# Patient Record
Sex: Female | Born: 1965 | Race: White | Hispanic: No | Marital: Married | Smoking: Current every day smoker
Health system: Southern US, Community
[De-identification: ages and names within clinical notes are randomized; demographics above are authoritative.]

## PROBLEM LIST (undated history)

## (undated) ENCOUNTER — Emergency Department (HOSPITAL_BASED_OUTPATIENT_CLINIC_OR_DEPARTMENT_OTHER)

## (undated) DIAGNOSIS — R319 Hematuria, unspecified: Secondary | ICD-10-CM

## (undated) DIAGNOSIS — F419 Anxiety disorder, unspecified: Secondary | ICD-10-CM

## (undated) DIAGNOSIS — M1990 Unspecified osteoarthritis, unspecified site: Secondary | ICD-10-CM

## (undated) DIAGNOSIS — B001 Herpesviral vesicular dermatitis: Secondary | ICD-10-CM

## (undated) HISTORY — PX: NOSE SURGERY: SHX723

## (undated) HISTORY — DX: Unspecified osteoarthritis, unspecified site: M19.90

## (undated) HISTORY — DX: Anxiety disorder, unspecified: F41.9

## (undated) HISTORY — DX: Herpesviral vesicular dermatitis: B00.1

## (undated) HISTORY — DX: Hematuria, unspecified: R31.9

## (undated) HISTORY — PX: HAND SURGERY: SHX662

---

## 2003-05-27 ENCOUNTER — Encounter (HOSPITAL_COMMUNITY): Admission: RE | Admit: 2003-05-27 | Discharge: 2003-08-25 | Payer: Self-pay | Admitting: Internal Medicine

## 2006-11-01 ENCOUNTER — Encounter: Admission: RE | Admit: 2006-11-01 | Discharge: 2006-11-01 | Payer: Self-pay | Admitting: Emergency Medicine

## 2007-03-24 ENCOUNTER — Ambulatory Visit: Payer: Self-pay | Admitting: Urology

## 2007-06-15 DIAGNOSIS — R319 Hematuria, unspecified: Secondary | ICD-10-CM

## 2007-06-15 HISTORY — DX: Hematuria, unspecified: R31.9

## 2007-12-21 ENCOUNTER — Encounter
Admission: RE | Admit: 2007-12-21 | Discharge: 2007-12-21 | Payer: Self-pay | Admitting: Physical Medicine and Rehabilitation

## 2007-12-29 ENCOUNTER — Ambulatory Visit: Payer: Self-pay | Admitting: Family Medicine

## 2008-01-24 ENCOUNTER — Inpatient Hospital Stay (HOSPITAL_COMMUNITY): Admission: RE | Admit: 2008-01-24 | Discharge: 2008-01-25 | Payer: Self-pay | Admitting: Orthopedic Surgery

## 2008-01-24 ENCOUNTER — Encounter (INDEPENDENT_AMBULATORY_CARE_PROVIDER_SITE_OTHER): Payer: Self-pay | Admitting: Orthopedic Surgery

## 2008-04-02 ENCOUNTER — Ambulatory Visit: Payer: Self-pay | Admitting: Family Medicine

## 2008-06-14 HISTORY — PX: SPINE SURGERY: SHX786

## 2010-06-03 ENCOUNTER — Ambulatory Visit: Payer: Self-pay | Admitting: Family Medicine

## 2010-07-04 ENCOUNTER — Encounter: Payer: Self-pay | Admitting: Internal Medicine

## 2010-10-27 NOTE — Op Note (Signed)
Nichole Klein, Nichole Klein NO.:  [id number]   MEDICAL RECORD NO.:  [id number]          PATIENT TYPE:  INP   LOCATION:  3017                         FACILITY:  [facility]   PHYSICIAN:  Nelda Severe, MD      DATE OF BIRTH:  1965/09/12   DATE OF PROCEDURE:  01/24/2008  DATE OF DISCHARGE:                               OPERATIVE REPORT   SURGEON:  Nelda Severe, MD   ASSISTANT:  Lianne Cure, PA-C   PREOPERATIVE DIAGNOSIS:  Left L5-S1 disk herniation.   POSTOPERATIVE DIAGNOSIS:  Left L5-S1 disk herniation.   OPERATIVE PROCEDURE:  Left L5-S1 laminotomy and disk excision.   OPERATIVE FINDINGS:  There was a subligamentous disk herniation in the  usual posterolateral location with a tiny rent in the posterior  longitudinal ligament, but no gross extrusion of the disk through the  rent.   OPERATIVE PROCEDURE:  The patient was placed under general endotracheal  anesthesia.  Prophylactic antibiotics were administered intravenously.  She was positioned kneeling on a Andrews frame.  The tibias were padded  with a gel pad on the tibial support.  The upper extremities were  positioned so as to avoid hyperflexion and abduction of the shoulders  and so as to avoid hyperflexion of the elbows.  Because of the patient's  thick adipose layer, it was difficult to palpate the bony landmarks of  the lumbar spine.  The vertical incision was placed just to the left of  midline at what I thought was probably the L4-L5 through L5-S1 level.  Once preparation had been carried out with DuraPrep and draping  performed, a cross-table lateral radiograph was taken with a spinal  needle placed at what we approximated to be the L4-L5 level.  This  radiograph revealed that the needle was at the L4 level.   Before making an incision, a time-out was held and the patient  identified, as well as the preoperative diagnosis, the intended  procedure, the likely length of the procedure, the anticipated  blood  loss, and introductions of the surgical team, etc., etc.   A incision was made just left of center over what was perceived to be  the L5-S1 disk space level.  Dissection was carried down through  approximately 1.5-2 inches of adipose tissue to the thoracolumbar  fascia.  An incision was made in the thoracolumbar fascia just to the  left of midline.  The muscles were elevated off the spinous processes at  what proved to be L5 and S1.  The posterior aspect of sacrum was  positively identified with there being no mobile segment below when we  were operating at.   Next, we performed a approximately 25% medial-inferior articular process  facetectomy at L5 and thinned out the lamina proximally with the 3-mm  Kerrison rongeur.  The ligamentum flavum was detached distally along the  lamina of L5 and medially under the supra-articular process of S1.  The  ligamentum flavum was then removed from the laminotomy site.  At this  point, having removed some more of the medial edge of the supra-  articular process of S1 with  a Kerrison rongeur, the exposure was  complete.  There was a large epidural vein, lateral to the S1 nerve root  which was bipolar coagulated.  The nerve root was mobilized and  retracted medially with a Love nerve root retractor.  It was then  possible to seat the disk herniation with a very tiny, less than 1 mm  rent, and a very tiny amount of nucleus pulposus exuding through the  rent.  This rent was enlarged with a Cytogeneticist and then a ball-  tipped nerve hook used to deliver numerous small fragments of disk.  We  then enlarged a rent and eventually performed a formal annulotomy with a  15 blade lateral to the point of disk extrusion.  The disk space was  then curetted with Epstein and Scoville curettes and eventually with a  ring curette and numerous small fragments of disk removed.  The S1 nerve  root appeared very well decompressed.  We did take a final  cross-table  lateral radiograph with a instrument in the L5-S1 disk space to confirm  and document that we were at the correct level.   The disk space was irrigated out with saline solution and no more  fragments were delivered.   There was minimal oozing, however, we did place a subfascial 15 gauge  Blake drain brought out through the skin to the left side and secured it  with a 2-0 nylon suture.  The thoracolumbar fascia was then reapposed  using interrupted figure-of-eight 0 Vicryl sutures.  The subcutaneous  layer was closed using 2-0 Vicryl in interrupted fashion.  The skin was  closed using a running subcuticular 3-0 undyed Vicryl and the skin edges  reinforced with Steri-Strips.  Antibiotic ointment dressing was applied  and secured with OpSite.   There were no intraoperative complications.  Blood loss was less than  100 mL.  The patient returned to recovery room in satisfactory condition  where she has been examined and has good power of dorsiflexion and  plantar flexion bilaterally and has no complaints of left leg pain.      Nelda Severe, MD  Electronically Signed     MT/MEDQ  D:  01/24/2008  T:  01/25/2008  Job:  [id number]

## 2010-10-27 NOTE — Discharge Summary (Signed)
NAMEAUBREE, Nichole Klein             ACCOUNT NO.:  [id number]   MEDICAL RECORD NO.:  [id number]          PATIENT TYPE:  INP   LOCATION:  3017                         FACILITY:  [facility]   PHYSICIAN:  Nelda Severe, MD      DATE OF BIRTH:  02-07-1966   DATE OF ADMISSION:  01/24/2008  DATE OF DISCHARGE:  01/25/2008                               DISCHARGE SUMMARY   FINAL DIAGNOSIS:  L5-S1 disk herniation, left side.   HOSPITAL COURSE:  The patient was brought to the operating room on the  day of admission.  An uneventful left L5-S1 laminotomy and disk excision  was carried out.  Postoperatively, she had no left leg pain, but did of  course had back pain in the region of the incision.  Pain was controlled  with oral analgesics.  She had voided.  The incision was inspected and  was dry without drainage.  A Blake drain which had been inserted at the  time of surgery was removed.   The patient was ambulatory.  She was given a prescription for Norco 10  one q.4 h. p.r.n. 75 tablets.   She was instructed to avoid prolonged sitting.  She will walk as much as  possible.  She will return to the office in approximately 2 weeks,  sooner if there is a problem.  I told her husband about the fact that  incisional pain is usually significantly better 72 hours after the  surgery, but that her back incision will be sore for about 6 weeks.   She was afebrile with stable vital signs at the time of discharge.      Nelda Severe, MD  Electronically Signed     MT/MEDQ  D:  01/25/2008  T:  01/25/2008  Job:  [id number]

## 2011-03-12 LAB — CBC
HCT: 35.9 — ABNORMAL LOW
HCT: 42.2
Hemoglobin: 12.3
Hemoglobin: 14.1
MCHC: 33.5
MCHC: 34.3
MCV: 94.9
MCV: 95.1
Platelets: 283
Platelets: 308
RBC: 3.78 — ABNORMAL LOW
RBC: 4.43
RDW: 14.2
RDW: 14.4
WBC: 9.2
WBC: 9.7

## 2011-03-12 LAB — COMPREHENSIVE METABOLIC PANEL
ALT: 29
AST: 23
Albumin: 3.5
Alkaline Phosphatase: 68
BUN: 6
CO2: 28
Calcium: 9.5
Chloride: 101
Creatinine, Ser: 0.7
GFR calc Af Amer: >60
GFR calc non Af Amer: >60
Glucose, Bld: 83
Potassium: 4.6
Sodium: 137
Total Bilirubin: 0.8
Total Protein: 6.3

## 2011-03-12 LAB — DIFFERENTIAL
Basophils Absolute: 0
Basophils Relative: 1
Eosinophils Absolute: 0.1
Eosinophils Relative: 2
Lymphocytes Relative: 24
Lymphs Abs: 2.2
Monocytes Absolute: 0.5
Monocytes Relative: 6
Neutro Abs: 6.3
Neutrophils Relative %: 68

## 2011-03-12 LAB — URINALYSIS, ROUTINE W REFLEX MICROSCOPIC
Bilirubin Urine: NEGATIVE
Glucose, UA: NEGATIVE
Hgb urine dipstick: NEGATIVE
Ketones, ur: NEGATIVE
Nitrite: NEGATIVE
Protein, ur: NEGATIVE
Specific Gravity, Urine: 1.025
Urobilinogen, UA: 0.2
pH: 6

## 2011-03-12 LAB — BASIC METABOLIC PANEL
BUN: 5 — ABNORMAL LOW
CO2: 29
Calcium: 8.8
Chloride: 104
Creatinine, Ser: 0.64
GFR calc Af Amer: >60
GFR calc non Af Amer: >60
Glucose, Bld: 117 — ABNORMAL HIGH
Potassium: 3.5
Sodium: 139

## 2011-03-12 LAB — TYPE AND SCREEN
ABO/RH(D): A NEG
Antibody Screen: NEGATIVE

## 2011-03-12 LAB — APTT: aPTT: 29

## 2011-03-12 LAB — URINE CULTURE: Colony Count: 9000

## 2011-03-12 LAB — ABO/RH: ABO/RH(D): A NEG

## 2011-03-12 LAB — PROTIME-INR
INR: 0.9
Prothrombin Time: 12.2

## 2011-06-07 ENCOUNTER — Ambulatory Visit (INDEPENDENT_AMBULATORY_CARE_PROVIDER_SITE_OTHER): Payer: BC Managed Care – PPO

## 2011-06-07 DIAGNOSIS — S93629A Sprain of tarsometatarsal ligament of unspecified foot, initial encounter: Secondary | ICD-10-CM

## 2011-06-07 DIAGNOSIS — M79609 Pain in unspecified limb: Secondary | ICD-10-CM

## 2011-06-23 ENCOUNTER — Ambulatory Visit: Payer: Self-pay | Admitting: Family Medicine

## 2011-07-14 ENCOUNTER — Ambulatory Visit (INDEPENDENT_AMBULATORY_CARE_PROVIDER_SITE_OTHER): Payer: BC Managed Care – PPO | Admitting: Family Medicine

## 2011-07-14 VITALS — BP 104/48 | HR 66 | Temp 99.0°F | Resp 16

## 2011-07-14 DIAGNOSIS — J029 Acute pharyngitis, unspecified: Secondary | ICD-10-CM

## 2011-07-14 DIAGNOSIS — Z720 Tobacco use: Secondary | ICD-10-CM | POA: Insufficient documentation

## 2011-07-14 DIAGNOSIS — F17200 Nicotine dependence, unspecified, uncomplicated: Secondary | ICD-10-CM

## 2011-07-14 DIAGNOSIS — R509 Fever, unspecified: Secondary | ICD-10-CM

## 2011-07-14 LAB — POCT RAPID STREP A (OFFICE): Rapid Strep A Screen: POSITIVE — AB

## 2011-07-14 MED ORDER — PENICILLIN G BENZATHINE & PROC 1200000 UNIT/2ML IM SUSP: 1.2000 10*6.[IU] | Freq: Once | INTRAMUSCULAR | Status: DC

## 2011-07-14 MED ORDER — PENICILLIN G BENZATHINE 1200000 UNIT/2ML IM SUSP
1.2000 10*6.[IU] | Freq: Once | INTRAMUSCULAR | Status: AC
  Administered 2011-07-14: 1.2 10*6.[IU] via INTRAMUSCULAR

## 2011-07-14 NOTE — Progress Notes (Signed)
  Subjective:    Patient ID: Nichole Klein, female    DOB: 12/11/1965, 46 y.o.   MRN: [id number]  Patient Name: Nichole Klein Date of Birth: Nov 28, 1965 Medical Record Number: [id number] Gender: female Date of Encounter: 07/14/2011  History of Present Illness:  Nichole Klein is a 46 y.o. very pleasant female patient who presents with the following:  Over the last 2 days had noted abdominal pain- mostly located in upper abdominal area.  No nausea or vomiting.  Then last night awakened with fever of 103, also had a severe ST and has noted "white stuff" on her throat.  Took 800 of ibuprofen which helped with her fever.    There is no problem list on file for this patient.  No past medical history on file. No past surgical history on file. H/O rhinoplasty for nasal fracture, also history of lumbar surgery to relieve pressure on sciatic nerve 2009.   History  Substance Use Topics  . Smoking status: Current Everyday Smoker  . Smokeless tobacco: Not on file  . Alcohol Use: Not on file   No family history on file. Allergies no known allergies  Medication list has been reviewed and updated.  Review of Systems: Stuffy nose, no runny nose.  Mild cough, is productive of yellow mucus.  Positive body aches, fatigue, fever and chills.  DID have flu shot this year but heavy flu exposure at her job.  No diarrhea.  Good appetite.    Physical Examination: Filed Vitals:   07/14/11 0747  BP: 104/48  Pulse: 66  Temp: 99 F (37.2 C)  TempSrc: Oral  Resp: 16    There is no height or weight on file to calculate BMI.  Assessment and Plan: As below  HPI    Review of Systems     Objective:   Physical Exam  Constitutional: She is oriented to person, place, and time. She appears well-developed and well-nourished.  HENT:  Head: Normocephalic and atraumatic.  Right Ear: External ear normal.  Left Ear: External ear normal.  Nose: Nose normal.  Mouth/Throat: Oropharyngeal exudate  present.  Eyes: Conjunctivae are normal. Pupils are equal, round, and reactive to light.  Cardiovascular: Normal rate, regular rhythm, normal heart sounds and intact distal pulses.  Exam reveals no gallop and no friction rub.   No murmur heard. Pulmonary/Chest: Effort normal. She has rales.       Minimal rales  Abdominal: Soft. Bowel sounds are normal. She exhibits no distension and no mass. There is no rebound and no guarding.       - Murphy's sign, minimal epigastric TTP   Musculoskeletal: Normal range of motion.  Neurological: She is alert and oriented to person, place, and time. No cranial nerve deficit.  Skin: Skin is warm and dry.  Psychiatric: She has a normal mood and affect. Her behavior is normal. Judgment and thought content normal.          Assessment & Plan:  Positive rapid strep 1. Acute pharyngitis  POCT rapid strep A, penicillin g benzathine (BICILLIN LA) 1200000 UNIT/2ML injection 1.2 Million Units, DISCONTINUED: penicillin g procaine-penicillin g benzathine (BICILLIN-CR) injection 600000-600000 units  2. Fever  POCT rapid strep A   Patient (or parent if minor) instructed to return to clinic or call if not better in 1-2 day(s).

## 2011-09-09 ENCOUNTER — Ambulatory Visit (INDEPENDENT_AMBULATORY_CARE_PROVIDER_SITE_OTHER): Payer: BC Managed Care – PPO | Admitting: Physician Assistant

## 2011-09-09 VITALS — BP 105/69 | HR 90 | Temp 98.3°F | Resp 16 | Ht 62.0 in | Wt 144.0 lb

## 2011-09-09 DIAGNOSIS — R05 Cough: Secondary | ICD-10-CM

## 2011-09-09 DIAGNOSIS — R059 Cough, unspecified: Secondary | ICD-10-CM

## 2011-09-09 DIAGNOSIS — J029 Acute pharyngitis, unspecified: Secondary | ICD-10-CM

## 2011-09-09 DIAGNOSIS — R509 Fever, unspecified: Secondary | ICD-10-CM

## 2011-09-09 LAB — POCT CBC
Granulocyte percent: 79.1 %G (ref 37–80)
HCT, POC: 41.7 % (ref 37.7–47.9)
Hemoglobin: 13.4 g/dL (ref 12.2–16.2)
Lymph, poc: 2.4 (ref 0.6–3.4)
MCH, POC: 31 pg (ref 27–31.2)
MCHC: 32.1 g/dL (ref 31.8–35.4)
MCV: 96.5 fL (ref 80–97)
MID (cbc): 0.8 (ref 0–0.9)
MPV: 10.1 fL (ref 0–99.8)
POC Granulocyte: 11.9 — AB (ref 2–6.9)
POC LYMPH PERCENT: 15.6 %L (ref 10–50)
POC MID %: 5.3 %M (ref 0–12)
Platelet Count, POC: 286 10*3/uL (ref 142–424)
RBC: 4.32 M/uL (ref 4.04–5.48)
RDW, POC: 14.4 %
WBC: 15.1 10*3/uL — AB (ref 4.6–10.2)

## 2011-09-09 LAB — POCT INFLUENZA A/B
Influenza A, POC: NEGATIVE
Influenza B, POC: NEGATIVE

## 2011-09-09 LAB — POCT RAPID STREP A (OFFICE): Rapid Strep A Screen: NEGATIVE

## 2011-09-09 MED ORDER — AMOXICILLIN 875 MG PO TABS: 875.0000 mg | ORAL_TABLET | Freq: Two times a day (BID) | ORAL | Status: AC

## 2011-09-09 MED ORDER — HYDROCOD POLST-CHLORPHEN POLST 10-8 MG/5ML PO LQCR: 5.0000 mL | Freq: Two times a day (BID) | ORAL | Status: DC

## 2011-09-09 MED ORDER — ALBUTEROL SULFATE HFA 108 (90 BASE) MCG/ACT IN AERS: 2.0000 | INHALATION_SPRAY | RESPIRATORY_TRACT | Status: DC | PRN

## 2011-09-09 NOTE — Progress Notes (Signed)
  Subjective:    Patient ID: Nichole Klein, female    DOB: 1965/08/29, 46 y.o.   MRN: [id number]  HPI Nichole Klein comes in today c/o myalgias, cough Tuesday night.  Now with 102 fever this am, ST, HA.  No N/V/D.  Has had influenza vaccine.  Had strep in Jan 2013. Smoker  Review of Systems As above      Objective:   Physical Exam  Constitutional: She is oriented to person, place, and time. Vital signs are normal. She appears well-developed and well-nourished. She appears ill.  HENT:  Right Ear: Tympanic membrane normal.  Left Ear: Tympanic membrane normal.  Mouth/Throat: Uvula is midline. Oropharyngeal exudate (small amount on right side), posterior oropharyngeal edema and posterior oropharyngeal erythema present.  Cardiovascular: Normal rate and regular rhythm.   Pulmonary/Chest: Effort normal and breath sounds normal.  Lymphadenopathy:    She has no cervical adenopathy.  Neurological: She is alert and oriented to person, place, and time.  Skin: Skin is warm and dry.    Results for orders placed in visit on 09/09/11  POCT INFLUENZA A/B      Component Value Range   Influenza A, POC Negative     Influenza B, POC Negative    POCT RAPID STREP A (OFFICE)      Component Value Range   Rapid Strep A Screen Negative  Negative   POCT CBC      Component Value Range   WBC 15.1 (*) 4.6 - 10.2 (K/uL)   Lymph, poc 2.4  0.6 - 3.4    POC LYMPH PERCENT 15.6  10 - 50 (%L)   MID (cbc) 0.8  0 - 0.9    POC MID % 5.3  0 - 12 (%M)   POC Granulocyte 11.9 (*) 2 - 6.9    Granulocyte percent 79.1  37 - 80 (%G)   RBC 4.32  4.04 - 5.48 (M/uL)   Hemoglobin 13.4  12.2 - 16.2 (g/dL)   HCT, POC 86.5  78.4 - 47.9 (%)   MCV 96.5  80 - 97 (fL)   MCH, POC 31.0  27 - 31.2 (pg)   MCHC 32.1  31.8 - 35.4 (g/dL)   RDW, POC 69.6     Platelet Count, POC 286  142 - 424 (K/uL)   MPV 10.1  0 - 99.8 (fL)       Assessment & Plan:  Leukocytosis Fever Cough Pharyingitis  Amox, Tussionex, Albuterol  inhaler Tylenol or advil.  Warm salt water gargles.  Watch for worsening sx. Call/return if worse.

## 2011-11-23 ENCOUNTER — Encounter: Payer: Self-pay | Admitting: Family Medicine

## 2011-11-23 ENCOUNTER — Ambulatory Visit (INDEPENDENT_AMBULATORY_CARE_PROVIDER_SITE_OTHER): Payer: BC Managed Care – PPO | Admitting: Family Medicine

## 2011-11-23 VITALS — BP 141/74 | HR 88 | Temp 97.7°F | Resp 16

## 2011-11-23 DIAGNOSIS — R079 Chest pain, unspecified: Secondary | ICD-10-CM

## 2011-11-23 NOTE — Progress Notes (Deleted)
  Subjective:    Patient ID: Nichole Klein, female    DOB: Nov 01, 1965, 46 y.o.   MRN: [id number]  HPI    Review of Systems     Objective:   Physical Exam        Assessment & Plan:

## 2011-11-23 NOTE — Progress Notes (Addendum)
Patient Name: Nichole Klein Date of Birth: 12/25/1965 Medical Record Number: [id number] Gender: female Date of Encounter: 11/23/2011  History of Present Illness:  Nichole Klein is a 46 y.o. very pleasant female patient who presents with the following:  Here with concern regarding CP.  Yesterday she noted a pressure/ discomfort in her substernal area and under her right breast that started about 4:30pm.  She felt the need to undo her bra to help relieve the pressure.  She noted pain if she laid on her right side last night.  This morning she felt pretty good when she first got up and while walking her dog, but the pain returned when she got into her car to drive to work.   She has had a couple of other episodes of CP in the past which have been attributed to MSK pain.    No SOB.   She has no known history of cardiac problems, never had a stress test or other cardiac evaluation.   No family history of cardiac issues.   She is a smoker and has been for some time.    Nichole Klein does admit that she is under a lot of stress recently due to personal issues.    Patient Active Problem List  Diagnoses  . Tobacco abuse   No past medical history on file. Past Surgical History  Procedure Date  . Spine surgery    History  Substance Use Topics  . Smoking status: Current Everyday Smoker  . Smokeless tobacco: Not on file  . Alcohol Use: Not on file   No family history on file. No Known Allergies  Medication list has been reviewed and updated.  Prior to Admission medications   Medication Sig Start Date End Date Taking? Authorizing Provider  albuterol (PROVENTIL HFA;VENTOLIN HFA) 108 (90 BASE) MCG/ACT inhaler Inhale 2 puffs into the lungs every 4 (four) hours as needed for wheezing. 09/09/11 09/08/12  Pattricia Boss, PA-C  ALPRAZolam Prudy Feeler) 0.5 MG tablet Take 0.5 mg by mouth at bedtime as needed.    Historical Provider, MD  chlorpheniramine-HYDROcodone (TUSSIONEX PENNKINETIC ER)  10-8 MG/5ML LQCR Take 5 mLs by mouth every 12 (twelve) hours. 09/09/11   Pattricia Boss, PA-C  ibuprofen (ADVIL,MOTRIN) 200 MG tablet Take 200 mg by mouth every 6 (six) hours as needed.    Historical Provider, MD    Review of Systems:  As per HPI- otherwise negative. No sweating, nausea or vomiting.  Eating did seem to help her pain yesterday  Physical Examination: There were no vitals filed for this visit. There were no vitals filed for this visit. There is no height or weight on file to calculate BMI.  Vitals entered after triage- brought back urgently GEN: WDWN, NAD, Non-toxic, A & O x 3 HEENT: Atraumatic, Normocephalic. Neck supple. No masses, No LAD.  TM and oropharynx wnl, PEERL Ears and Nose: No external deformity. CV: RRR, No M/G/R. No JVD. No thrill. No extra heart sounds. PULM: CTA B, no wheezes, crackles, rhonchi. No retractions. No resp. distress. No accessory muscle use. ABD: S, NT, ND, +BS. No rebound. No HSM.  Tenderness does not seem to be abdominal.  EXTR: No c/c/e NEURO Normal gait.  PSYCH: Normally interactive. Conversant. Not depressed or anxious appearing.  Calm demeanor.  Chest wall: no lesion or redness. Very tender to pressure over sternum.  Was able to reproduce this tenderness several times  EKG: sinus rhythm.  Compared to old EKG 7/12- no change.  Assessment and Plan: 1. Chest pain  Ambulatory referral to Cardiology   Likely MSK CP.  Her CP is reproducible, and normal EKG is reassuring.  However, Nichole Klein has had a few episodes of CP now and would like to have a cardiology evaluation. Also offered to arrange ED evaluation if she would prefer, but she declined this for now.  Arranged cardiology appt for tomorrow.  Advised her to take aspirin 81 mg #4 daily for now. She plans to go home and take one of her PRN xanax today and see how she feels.  Will seek care if she gets worse.  Appreciate consultation by cardiology of this very nice lady who is also our  employee.    Addnd:  Called to check on her at around 7 pm.  She states that her CP seems to be spreading across her chest.  I let her know that this is more concerning, and that ED evaluation would be the most conservative thing to do- she knows that an EKG is not as accurate as cardiac enzymes.  However, she is not sure if she wants to do this given her normal EKG and reproducible pain.  Offered to call the ED and help facilitate her evaluation, and Nichole Klein said she would think about it and call me back if she needed anything.   Nichole Amsterdam, MD

## 2011-11-24 ENCOUNTER — Ambulatory Visit (INDEPENDENT_AMBULATORY_CARE_PROVIDER_SITE_OTHER): Payer: Self-pay | Admitting: Cardiovascular Disease

## 2011-11-24 ENCOUNTER — Encounter: Payer: Self-pay | Admitting: Cardiovascular Disease

## 2011-11-24 VITALS — BP 130/76 | HR 85 | Ht 62.0 in | Wt 145.0 lb

## 2011-11-24 DIAGNOSIS — F411 Generalized anxiety disorder: Secondary | ICD-10-CM

## 2011-11-24 DIAGNOSIS — R079 Chest pain, unspecified: Secondary | ICD-10-CM | POA: Insufficient documentation

## 2011-11-24 DIAGNOSIS — F17200 Nicotine dependence, unspecified, uncomplicated: Secondary | ICD-10-CM

## 2011-11-24 DIAGNOSIS — Z720 Tobacco use: Secondary | ICD-10-CM

## 2011-11-24 DIAGNOSIS — F419 Anxiety disorder, unspecified: Secondary | ICD-10-CM

## 2011-11-24 NOTE — Assessment & Plan Note (Signed)
PRN BZ;s  May contribute to chest pain syndrome

## 2011-11-24 NOTE — Progress Notes (Signed)
Patient ID: Nichole Klein, female   DOB: July 01, 1965, 46 y.o.   MRN: [id number] 46 yo referred by Dr Dallas Schimke for chest pain.  Seen in urgent care yesterday.  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  Smokes a ppd.  Pain for 48 hrs.  Atypical persistant Exacerbated by position and palpation of epigastric area.  Had done some new upper arm exercises 4 days ago.  No previous CAD.  Does get wheezy with episodes of bronchitis.  No pain this am.  Pain is not exertional or pleuritic but is posititonal.  No previous stress test.    ROS: Denies fever, malais, weight loss, blurry vision, decreased visual acuity, cough, sputum, SOB, hemoptysis, pleuritic pain, palpitaitons, heartburn, abdominal pain, melena, lower extremity edema, claudication, or rash.  All other systems reviewed and negative   General: Affect appropriate Healthy:  appears stated age HEENT: normal Neck supple with no adenopathy JVP normal no bruits no thyromegaly Lungs clear with no wheezing and good diaphragmatic motion Heart:  S1/S2 no murmur,rub, gallop or click PMI normal Abdomen: benighn, BS positve, no tenderness, no AAA no bruit.  No HSM or HJR Distal pulses intact with no bruits No edema Neuro non-focal Skin warm and dry No muscular weakness  Medications Current Outpatient Prescriptions  Medication Sig Dispense Refill  . ALPRAZolam (XANAX) 0.5 MG tablet Take 0.5 mg by mouth at bedtime as needed.      Marland Kitchen ibuprofen (ADVIL,MOTRIN) 200 MG tablet Take 200 mg by mouth every 6 (six) hours as needed.        Allergies Review of patient's allergies indicates no known allergies.  Family History: Family History  Problem Relation Age of Onset  . Diabetes Mother   . Hypertension Mother   . Hyperlipidemia Father     Social History: History   Social History  . Marital Status: Married    Spouse Name: N/A    Number of Children: N/A  . Years of Education: N/A   Occupational History  . Not on file.   Social History Main Topics  .  Smoking status: Current Everyday Smoker  . Smokeless tobacco: Not on file  . Alcohol Use: Not on file  . Drug Use: Not on file  . Sexually Active: Not on file   Other Topics Concern  . Not on file   Social History Narrative  . No narrative on file    Electrocardiogram:  At urgent care NSR normal ECG no pericardial or ischemic changes.  Rate 71 no change from ECG 7/12 Assessment and Plan

## 2011-11-24 NOTE — Assessment & Plan Note (Signed)
Atypical with normal ECG  F/U stress echo this week if possible

## 2011-11-24 NOTE — Assessment & Plan Note (Signed)
Counseled for less than 10 minutes on risks of COPD and cancer. Has wheezing and would probably benefit from PFT;s and rescue inhaler  F/U Dr Katrinka Blazing

## 2011-11-24 NOTE — Patient Instructions (Addendum)
Your physician has requested that you have a stress echocardiogram. For further information please visit [email or web address] Please follow instruction sheet as given.  Your physician recommends that you continue on your current medications as directed. Please refer to the Current Medication list given to you today.  Your physician recommends that you schedule a follow-up appointment as needed with Dr. Eden Emms

## 2011-12-01 ENCOUNTER — Other Ambulatory Visit (HOSPITAL_COMMUNITY): Payer: Self-pay

## 2012-01-06 ENCOUNTER — Ambulatory Visit
Admission: RE | Admit: 2012-01-06 | Discharge: 2012-01-06 | Disposition: A | Discharge status: Home / Self Care | Payer: BC Managed Care – PPO | Source: Ambulatory Visit | Attending: Family Medicine | Admitting: Family Medicine

## 2012-01-06 ENCOUNTER — Ambulatory Visit (INDEPENDENT_AMBULATORY_CARE_PROVIDER_SITE_OTHER): Payer: BC Managed Care – PPO | Admitting: Family Medicine

## 2012-01-06 VITALS — BP 108/64 | HR 84 | Temp 97.8°F | Resp 16 | Ht 62.0 in | Wt 146.6 lb

## 2012-01-06 DIAGNOSIS — R109 Unspecified abdominal pain: Secondary | ICD-10-CM

## 2012-01-06 LAB — POCT URINALYSIS DIPSTICK
Bilirubin, UA: NEGATIVE
Glucose, UA: NEGATIVE
Ketones, UA: NEGATIVE
Leukocytes, UA: NEGATIVE
Nitrite, UA: NEGATIVE
Protein, UA: NEGATIVE
Spec Grav, UA: 1.02
Urobilinogen, UA: 1
pH, UA: 6

## 2012-01-06 LAB — POCT CBC
Granulocyte percent: 84.4 %G — AB (ref 37–80)
HCT, POC: 47.2 % (ref 37.7–47.9)
Hemoglobin: 14.4 g/dL (ref 12.2–16.2)
Lymph, poc: 1.3 (ref 0.6–3.4)
MCH, POC: 30.4 pg (ref 27–31.2)
MCHC: 30.5 g/dL — AB (ref 31.8–35.4)
MCV: 99.8 fL — AB (ref 80–97)
MID (cbc): 0.7 (ref 0–0.9)
MPV: 9.7 fL (ref 0–99.8)
POC Granulocyte: 11 — AB (ref 2–6.9)
POC LYMPH PERCENT: 10.3 %L (ref 10–50)
POC MID %: 5.3 %M (ref 0–12)
Platelet Count, POC: 278 10*3/uL (ref 142–424)
RBC: 4.73 M/uL (ref 4.04–5.48)
RDW, POC: 13.8 %
WBC: 13 10*3/uL — AB (ref 4.6–10.2)

## 2012-01-06 LAB — POCT URINE PREGNANCY: Preg Test, Ur: NEGATIVE

## 2012-01-06 MED ORDER — IOHEXOL 300 MG/ML  SOLN
100.0000 mL | Freq: Once | INTRAMUSCULAR | Status: AC | PRN
  Administered 2012-01-06: 100 mL via INTRAVENOUS

## 2012-01-06 MED ORDER — IOHEXOL 300 MG/ML  SOLN
40.0000 mL | Freq: Once | INTRAMUSCULAR | Status: AC | PRN
  Administered 2012-01-06: 40 mL via ORAL

## 2012-01-06 NOTE — Progress Notes (Signed)
[facility] [address], [place] [address] [phone]  Date:  01/06/2012   Name:  Nichole Klein   DOB:  07/27/1965   MRN:  [id number]  PCP:  No primary provider on file.    Chief Complaint: Groin Pain   History of Present Illness:  Nichole Klein is a 46 y.o. very pleasant female patient who presents with the following:  Yesterday am upon awakening she noted pain in her lower abdominal/ lower pubic area.  The pain seems to have settled into the left side now.  When she coughs, sneezes, strains, etc she needs to hold the area.  On Saturday she took care of her 60 lb nephew.  She lifted him a couple of times into a car seat, and helped her daughter move on Sunday.  She wonder is she could have sustained a hernia.   It hurts to walk.   She "was up and down all night long."  She had a cold sweat and felt nauseated a couple of times due to pain, but no vomiting.    She has never had diverticulitis.  LMP stated on 12/15/11.  Her husband has had a vasectomy and she states a pregnancy is not possible.    She had a CT of her abdomen about 7 years ago due to diarrhea.    She has not taken any medication except for advil yesterday.  She has had decreased appetite today.   Patient Active Problem List  Diagnosis  . Tobacco abuse  . Chest pain  . Anxiety    Past Medical History  Diagnosis Date  . Chest pain   . Anxiety     Past Surgical History  Procedure Date  . Spine surgery   . Nose surgery     History  Substance Use Topics  . Smoking status: Current Everyday Smoker  . Smokeless tobacco: Not on file  . Alcohol Use: Not on file    Family History  Problem Relation Age of Onset  . Diabetes Mother   . Hypertension Mother   . Hyperlipidemia Father     No Known Allergies  Medication list has been reviewed and updated.  Current Outpatient Prescriptions on File Prior to Visit  Medication Sig Dispense Refill  . ALPRAZolam (XANAX) 0.5 MG  tablet Take 0.5 mg by mouth at bedtime as needed.      Marland Kitchen ibuprofen (ADVIL,MOTRIN) 200 MG tablet Take 200 mg by mouth every 6 (six) hours as needed.        Review of Systems:  As per HPI- otherwise negative.   Physical Examination: Filed Vitals:   01/06/12 0841  BP: 108/64  Pulse: 84  Temp: 97.8 F (36.6 C)  Resp: 16   Filed Vitals:   01/06/12 0841  Height: 5\' 2"  (1.575 m)  Weight: 146 lb 9.6 oz (66.497 kg)   Body mass index is 26.81 kg/(m^2). Ideal Body Weight: Weight in (lb) to have BMI = 25: 136.4   GEN: WDWN, NAD, Non-toxic, A & O x 3 HEENT: Atraumatic, Normocephalic. Neck supple. No masses, No LAD. Ears and Nose: No external deformity. CV: RRR, No M/G/R. No JVD. No thrill. No extra heart sounds. PULM: CTA B, no wheezes, crackles, rhonchi. No retractions. No resp. distress. No accessory muscle use. ABD: S, ND, +BS. No rebound. No HSM.  Severe tenderness in the LLQ and suprapubic area. I am not able to appreciate any hernia or bulge.   GU: normal external  exam, vaginal canal wnl.  No CMT.  Does have tenderness in the left adnexal area but no masses.  EXTR: No c/c/e NEURO Normal gait.  PSYCH: Normally interactive. Conversant. Not depressed or anxious appearing.  Calm demeanor.    Results for orders placed in visit on 01/06/12  POCT CBC      Component Value Range   WBC 13.0 (*) 4.6 - 10.2 K/uL   Lymph, poc 1.3  0.6 - 3.4   POC LYMPH PERCENT 10.3  10 - 50 %L   MID (cbc) 0.7  0 - 0.9   POC MID % 5.3  0 - 12 %M   POC Granulocyte 11.0 (*) 2 - 6.9   Granulocyte percent 84.4 (*) 37 - 80 %G   RBC 4.73  4.04 - 5.48 M/uL   Hemoglobin 14.4  12.2 - 16.2 g/dL   HCT, POC 45.4  09.8 - 47.9 %   MCV 99.8 (*) 80 - 97 fL   MCH, POC 30.4  27 - 31.2 pg   MCHC 30.5 (*) 31.8 - 35.4 g/dL   RDW, POC 11.9     Platelet Count, POC 278  142 - 424 K/uL   MPV 9.7  0 - 99.8 fL  POCT URINE PREGNANCY      Component Value Range   Preg Test, Ur Negative    POCT URINALYSIS DIPSTICK       Component Value Range   Color, UA yellow     Clarity, UA clear     Glucose, UA neg     Bilirubin, UA neg     Ketones, UA neg     Spec Grav, UA 1.020     Blood, UA small     pH, UA 6.0     Protein, UA neg     Urobilinogen, UA 1.0     Nitrite, UA neg     Leukocytes, UA Negative      Assessment and Plan: 1. Abdominal  pain, other specified site  POCT CBC, POCT urine pregnancy, POCT urinalysis dipstick, CT Abdomen Pelvis W Contrast   I am concerned that Nichole Klein may have diverticulitis.  Sent for CT abdomen and pelvis- see results.  Concern for PID.  Given the sensitive nature of this problem Nichole Klein elected to see her PCP Dr. Katrinka Blazing this  If possible.  I called Dr. Katrinka Blazing who was able to see Nichole Klein at 4pm.  We greatly appreciate her seeing Nichole Klein today.    Abbe Amsterdam, MD

## 2012-03-03 ENCOUNTER — Telehealth: Payer: Self-pay | Admitting: Radiology

## 2012-03-03 NOTE — Telephone Encounter (Signed)
Sorry, we cannot just send in Amoxicillin without an evaluation.

## 2012-03-03 NOTE — Telephone Encounter (Signed)
Needs Amox for dental problem [facility], has appt on Wed with the dentist.

## 2012-04-15 ENCOUNTER — Ambulatory Visit (INDEPENDENT_AMBULATORY_CARE_PROVIDER_SITE_OTHER): Payer: BC Managed Care – PPO | Admitting: Family Medicine

## 2012-04-15 ENCOUNTER — Encounter: Payer: Self-pay | Admitting: Family Medicine

## 2012-04-15 VITALS — HR 77 | Temp 98.3°F | Resp 16

## 2012-04-15 DIAGNOSIS — S0502XA Injury of conjunctiva and corneal abrasion without foreign body, left eye, initial encounter: Secondary | ICD-10-CM

## 2012-04-15 DIAGNOSIS — H5710 Ocular pain, unspecified eye: Secondary | ICD-10-CM

## 2012-04-15 DIAGNOSIS — H5712 Ocular pain, left eye: Secondary | ICD-10-CM

## 2012-04-15 DIAGNOSIS — S058X9A Other injuries of unspecified eye and orbit, initial encounter: Secondary | ICD-10-CM

## 2012-04-15 MED ORDER — OFLOXACIN 0.3 % OP SOLN: 2.0000 [drp] | OPHTHALMIC | Status: DC

## 2012-04-15 NOTE — Progress Notes (Signed)
[address]   [place], [place]  [address]   [phone]  Subjective:    Patient ID: Nichole Klein, female    DOB: 1965/09/20, 46 y.o.   MRN: [id number]  HPIThis 46 y.o. female presents for evaluation of L eye pain.  Putting on mascara this morning, and shoved eye shadow brush into eye.  Horrible eye pain L all day.  +tearing; +burning.  Husband had to drive pt to baby shower.  +photophobia.  +blurred vision mildly.  Making L side of nose run.  Tetanus UTD in 2011.  Eyelid has been shut all day.  Applying ice to eye with minimal relief.   Review of Systems  Constitutional: Negative for fever, chills, diaphoresis and fatigue.  Eyes: Positive for photophobia, pain, redness and visual disturbance. Negative for discharge and itching.  Neurological: Positive for headaches. Negative for dizziness, facial asymmetry and light-headedness.    Past Medical History  Diagnosis Date  . Chest pain   . Anxiety     Past Surgical History  Procedure Date  . Spine surgery   . Nose surgery     Prior to Admission medications   Medication Sig Start Date End Date Taking? Authorizing Provider  ALPRAZolam Prudy Feeler) 0.5 MG tablet Take 0.5 mg by mouth at bedtime as needed.    Historical Provider, MD  ibuprofen (ADVIL,MOTRIN) 200 MG tablet Take 200 mg by mouth every 6 (six) hours as needed.    Historical Provider, MD  ofloxacin (OCUFLOX) 0.3 % ophthalmic solution Place 2 drops into the left eye every 2 (two) hours. 04/15/12   Ethelda Chick, MD    No Known Allergies  History   Social History  . Marital Status: Married    Spouse Name: N/A    Number of Children: N/A  . Years of Education: N/A   Occupational History  . Not on file.   Social History Main Topics  . Smoking status: Current Every Day Smoker  . Smokeless tobacco: Not on file  . Alcohol Use: Not on file  . Drug Use: Not on file  . Sexually Active: Not on file   Other Topics Concern  . Not on file   Social History Narrative  .  No narrative on file    Family History  Problem Relation Age of Onset  . Diabetes Mother   . Hypertension Mother   . Hyperlipidemia Father         Objective:   Physical Exam  Nursing note and vitals reviewed. Constitutional: She is oriented to person, place, and time. She appears well-developed and well-nourished. She appears distressed.  HENT:  Head: Normocephalic and atraumatic.  Eyes: Lids are normal. Pupils are equal, round, and reactive to light. Left eye exhibits no discharge and no exudate. No foreign body present in the left eye. Left conjunctiva is injected. Left conjunctiva has no hemorrhage. Left eye exhibits normal extraocular motion and no nystagmus.         Fluoroscein update as demonstrated.  Neck: Normal range of motion. Neck supple.  Neurological: She is alert and oriented to person, place, and time.  Skin: Skin is warm and dry. No rash noted. She is not diaphoretic. No erythema.  Psychiatric: She has a normal mood and affect. Her behavior is normal.    PROCEDURE:  L EYE INSPECTED; NO FOREIGN BODY IDENTIFIED.  FLUORESCEIN APPLIED; LARGE AREA OF UPTAKE INFERIOR ASPECT OF IRIS.  NO PENETRATION; NO LEAKAGE OF FLUORESCEIN.  PRESSURES OF ORBITS EQUAL.  L  EYE IRRIGATED WITH STERILE WATER; PT TOLERATED PROCEDURE WELL.     Assessment & Plan:   1. Pain, eye, left   2. Left corneal abrasion      1.  Pain L Eye:  New.  Secondary to corneal abrasion due to trauma.  Start Ibuprofen 800mg  tid; Vicodin 5/500 1-2 every six hours PRN pain. 2. L eye corneal abrasion: New.  Rx for Ocuflox ophthalmic drops apply 2 drops to eye every 2-3 hours; 24 hour follow-up; normal vision.

## 2012-04-15 NOTE — Patient Instructions (Addendum)
1. Pain, eye, left   2. Left corneal abrasion    Corneal Abrasion The cornea is the clear covering at the front and center of the eye. When looking at the colored portion (iris) of the eye, you are looking through that person's cornea.  This very thin tissue is made up of many layers. The surface layer is a single layer of cells called the corneal epithelium. This is one of the most sensitive tissues in the body. If a scratch or injury causes the corneal epithelium to come off, it is called a corneal abrasion. If the injury extends to the tissues below the epithelium, the condition is called a corneal ulcer.  CAUSES   Scratches.  Trauma.  Foreign body in the eye.  Some people have recurrences of abrasions in the area of the original injury even after they heal. This is called recurrent erosion syndrome. Recurrent erosion syndromes generally improve and go away with time. SYMPTOMS   Eye pain.  Difficulty or inability to keep the injured eye open.  The eye becomes very sensitive to light.  Recurrent erosions tend to happen suddenly, first thing in the morning  usually upon awakening and opening the eyes. DIAGNOSIS  Your eye professional can diagnose a corneal abrasion during an eye exam. Dye is usually placed in the eye using a drop or a small paper strip moistened by the patient's tears. When the eye is examined with a special light, the abrasion shows up clearly because of the dye. TREATMENT   Small abrasions may be treated with antibiotic drops or ointment alone.  Usually a pressure patch is specially applied. Pressure patches prevent the eye from blinking, allowing the corneal epithelium to heal. Because blinking is less, a pressure patch also reduces the amount of pain present in the eye during healing. Most corneal abrasions heal within 2-3 days with no effect on vision. WARNING: Do not drive or operate machinery while your eye is patched. Your ability to judge distances is  impaired.  If abrasion becomes infected and spreads to the deeper tissues of the cornea, a corneal ulcer can result. This is serious because it can cause corneal scarring. Corneal scars interfere with light passing through the cornea, and cause a loss of vision in the involved eye.  If your caregiver has given you a follow-up appointment, it is very important to keep that appointment. Not keeping the appointment could result in a severe eye infection or permanent loss of vision. If there is any problem keeping the appointment, you must call back to this facility for assistance. SEEK MEDICAL CARE IF:   You have pain, light sensitivity and a scratchy feeling in one eye (or both).  Your pressure patch keeps loosening up and you can blink your eye under the patch after treatment.  Any kind of discharge develops from the involved eye after treatment or if the lids stick together in the morning.  You have the same symptoms in the morning as you did with the original abrasion days, weeks or months after the abrasion healed. MAKE SURE YOU:   Understand these instructions.  Will watch your condition.  Will get help right away if you are not doing well or get worse. Document Released: 05/28/2000 Document Revised: 08/23/2011 Document Reviewed: 01/04/2008 [facility] Patient Information 2013 [place], [place].

## 2012-04-16 ENCOUNTER — Ambulatory Visit (INDEPENDENT_AMBULATORY_CARE_PROVIDER_SITE_OTHER): Payer: BC Managed Care – PPO | Admitting: Family Medicine

## 2012-04-16 VITALS — BP 133/80 | HR 84 | Temp 98.3°F | Resp 18 | Ht 62.0 in

## 2012-04-16 DIAGNOSIS — S0500XA Injury of conjunctiva and corneal abrasion without foreign body, unspecified eye, initial encounter: Secondary | ICD-10-CM

## 2012-04-16 DIAGNOSIS — S00209A Unspecified superficial injury of unspecified eyelid and periocular area, initial encounter: Secondary | ICD-10-CM

## 2012-04-16 NOTE — Progress Notes (Signed)
[address]   [place], [place]  [address]   [phone]  Subjective:    Patient ID: Nichole Klein, female    DOB: 04-22-1966, 46 y.o.   MRN: [id number]  HPIThis 46 y.o. female presents for 24 hour follow-up for L corneal abrasion.  Minimally improved today.  Continues to suffer with pain, tearing, photophobia, foreign body sensation.  Vision fine but eye does not want to open; remains shut.  Compliance with eye drops every 2-3 hours.     Review of Systems  Constitutional: Negative for fever, chills, diaphoresis and fatigue.  Eyes: Positive for photophobia, pain, discharge, redness and visual disturbance. Negative for itching.  Neurological: Positive for headaches.       Past Surgical History  Procedure Date  . Nose surgery   . Spine surgery 06/14/2008    Lumbar surgery L5-S1                                                             No Known Allergies  History   Social History  . Marital Status: Married    Spouse Name: N/A    Number of Children: N/A  . Years of Education: N/A   Occupational History  . Not on file.   Social History Main Topics  . Smoking status: Current Every Day Smoker -- 1.5 packs/day for 25 years  . Smokeless tobacco: Never Used  . Alcohol Use: No     Comment: special occasions only.  . Drug Use: No  . Sexually Active: Yes    Birth Control/ Protection: Surgical     Comment: Husband with vasectomy.   Other Topics Concern  . Not on file   Social History Narrative   Marital status: married x 25 years; happily married; no abuse.   Children: two daughters; three grandchildren.     Employment: [employer] x 2005.     Tobacco:  1-1.5 ppd x 20 years.   Alcohol: twice yearly   Drugs: none   Exercise: sporadic.    Family History  Problem Relation Age of Onset  . Diabetes Mother   . Hypertension Mother   . Hyperlipidemia Mother   . Hyperlipidemia Father   . Cancer Father     prostate  . Stroke Father   . Aortic stenosis Sister   .  Hyperlipidemia Brother   . Diabetes Brother   . Hyperlipidemia Brother   . Arthritis Brother      Objective:   Physical Exam  Nursing note and vitals reviewed. Constitutional: She is oriented to person, place, and time. She appears well-developed and well-nourished. She appears distressed.  HENT:  Head: Normocephalic and atraumatic.  Eyes: EOM are normal. Pupils are equal, round, and reactive to light. Right eye exhibits discharge.       Mild erythema of L conjunctiva; no active drainage.  PROCEDURE: FLUORESCEIN APPLIED; DECREASED UPTAKE AT LOCATION OF ABRASION YET PERSISTENT AREA OF UPTAKE.  Neck: Normal range of motion. Neck supple.  Neurological: She is alert and oriented to person, place, and time.  Psychiatric: She has a normal mood and affect. Her behavior is normal. Judgment and thought content normal.       Assessment & Plan:   1. Corneal abrasion     1. L Corneal Abrasion:  Improving but  persistent. Continue drops every 2-3 hours for next 24 hours; follow-up again in 24 hours; normal vision.

## 2012-04-17 ENCOUNTER — Ambulatory Visit (INDEPENDENT_AMBULATORY_CARE_PROVIDER_SITE_OTHER): Payer: BC Managed Care – PPO | Admitting: Family Medicine

## 2012-04-17 VITALS — BP 130/76 | HR 73 | Temp 97.0°F | Resp 16 | Ht 62.0 in

## 2012-04-17 DIAGNOSIS — S0500XA Injury of conjunctiva and corneal abrasion without foreign body, unspecified eye, initial encounter: Secondary | ICD-10-CM

## 2012-04-17 DIAGNOSIS — S058X9A Other injuries of unspecified eye and orbit, initial encounter: Secondary | ICD-10-CM

## 2012-04-17 NOTE — Progress Notes (Signed)
   [address]   [place], [place]  [address]   [phone]  Subjective:    Patient ID: Nichole Klein, female    DOB: 05-Mar-1966, 46 y.o.   MRN: [id number]  HPI This 46 y.o. female presents for 24 hour follow-up for L corneal abrasion.  Minimal symptomatic relief in past 24 hours.  Continues to suffer with eye pain.  +photophobia.  No foreign body sensation.  No blurred vision.  Administering Ocuflox every 3 hours since last visit. Compliance with Ibuprofen and Vicodin with minimal pain relief.  Missing work today.   Review of Systems  Constitutional: Negative for fever, chills, diaphoresis and fatigue.  Eyes: Positive for photophobia, pain and redness. Negative for discharge, itching and visual disturbance.       Objective:   Physical Exam  Constitutional: She is oriented to person, place, and time. She appears well-developed and well-nourished. No distress.  HENT:  Head: Normocephalic and atraumatic.  Eyes: Lids are normal. Pupils are equal, round, and reactive to light. Right eye exhibits no discharge. Left eye exhibits no discharge, no exudate and no hordeolum. No foreign body present in the left eye. Left conjunctiva is injected. Left conjunctiva has no hemorrhage. No scleral icterus. Left eye exhibits normal extraocular motion.         L EYE:  CONJUNCTIVA DIFFUSELY INJECTED.  NO FOREIGN BODY.  NO DRAINAGE/EXUDATE.  Neurological: She is alert and oriented to person, place, and time.  Skin: She is not diaphoretic.  Psychiatric: She has a normal mood and affect. Her behavior is normal.   PROCEDURE:  FLUOROSCEIN APPLIED TO L EYE; MINIMAL UPTAKE INFERIOR ASPECT OF CONJUNCTIVA.  SEE DIAGRAM ABOVE.        Assessment & Plan:   1. Corneal abrasion      1. Corneal Abrasion L: 85% improved today; continue with Ocuflox drops two drops every four hours for next week.  OOW today.  Vision normal.  Should be able to return to work tomorrow.  Continue Ibuprofen, Vicodin for pain.  RTC  if continues to have pain.

## 2012-04-20 ENCOUNTER — Telehealth: Payer: Self-pay | Admitting: Family Medicine

## 2012-04-20 DIAGNOSIS — B370 Candidal stomatitis: Secondary | ICD-10-CM

## 2012-04-20 MED ORDER — NYSTATIN 100000 UNIT/ML MT SUSP: 500000.0000 [IU] | Freq: Four times a day (QID) | OROMUCOSAL | Status: DC

## 2012-04-20 NOTE — Telephone Encounter (Signed)
Patient contacted Dr. Katrinka Blazing regarding burning, white film on tongue for one day.  Has been taking Ocuflox eye drops for corneal abrasion and pt thinks antibiotic eye drops has caused thrush.  Requesting rx being sent into pharmacy.  Objective:  Eye:  No conjunctival erythema.  Mouth: tongue beefy red with white film diffusely with prominent patches.  A/P:  Thrush:  New onset.  Will call in Nystatin swish and swallow qid x 7 days.    KMS

## 2012-06-27 ENCOUNTER — Ambulatory Visit (INDEPENDENT_AMBULATORY_CARE_PROVIDER_SITE_OTHER): Payer: BC Managed Care – PPO | Admitting: Internal Medicine

## 2012-06-27 VITALS — BP 110/76 | HR 86 | Temp 98.3°F | Resp 18 | Ht 62.0 in | Wt 152.0 lb

## 2012-06-27 DIAGNOSIS — R059 Cough, unspecified: Secondary | ICD-10-CM

## 2012-06-27 DIAGNOSIS — R05 Cough: Secondary | ICD-10-CM

## 2012-06-27 DIAGNOSIS — J029 Acute pharyngitis, unspecified: Secondary | ICD-10-CM

## 2012-06-27 LAB — POCT RAPID STREP A (OFFICE): Rapid Strep A Screen: NEGATIVE

## 2012-06-27 MED ORDER — AZITHROMYCIN 500 MG PO TABS: 500.0000 mg | ORAL_TABLET | Freq: Every day | ORAL | Status: DC

## 2012-06-27 NOTE — Patient Instructions (Signed)
Smoking Cessation Quitting smoking is important to your health and has many advantages. However, it is not always easy to quit since nicotine is a very addictive drug. Often times, people try 3 times or more before being able to quit. This document explains the best ways for you to prepare to quit smoking. Quitting takes hard work and a lot of effort, but you can do it. ADVANTAGES OF QUITTING SMOKING  You will live longer, feel better, and live better.  Your body will feel the impact of quitting smoking almost immediately.  Within 20 minutes, blood pressure decreases. Your pulse returns to its normal level.  After 8 hours, carbon monoxide levels in the blood return to normal. Your oxygen level increases.  After 24 hours, the chance of having a heart attack starts to decrease. Your breath, hair, and body stop smelling like smoke.  After 48 hours, damaged nerve endings begin to recover. Your sense of taste and smell improve.  After 72 hours, the body is virtually free of nicotine. Your bronchial tubes relax and breathing becomes easier.  After 2 to 12 weeks, lungs can hold more air. Exercise becomes easier and circulation improves.  The risk of having a heart attack, stroke, cancer, or lung disease is greatly reduced.  After 1 year, the risk of coronary heart disease is cut in half.  After 5 years, the risk of stroke falls to the same as a nonsmoker.  After 10 years, the risk of lung cancer is cut in half and the risk of other cancers decreases significantly.  After 15 years, the risk of coronary heart disease drops, usually to the level of a nonsmoker.  If you are pregnant, quitting smoking will improve your chances of having a healthy baby.  The people you live with, especially any children, will be healthier.  You will have extra money to spend on things other than cigarettes. QUESTIONS TO THINK ABOUT BEFORE ATTEMPTING TO QUIT You may want to talk about your answers with your  caregiver.  Why do you want to quit?  If you tried to quit in the past, what helped and what did not?  What will be the most difficult situations for you after you quit? How will you plan to handle them?  Who can help you through the tough times? Your family? Friends? A caregiver?  What pleasures do you get from smoking? What ways can you still get pleasure if you quit? Here are some questions to ask your caregiver:  How can you help me to be successful at quitting?  What medicine do you think would be best for me and how should I take it?  What should I do if I need more help?  What is smoking withdrawal like? How can I get information on withdrawal? GET READY  Set a quit date.  Change your environment by getting rid of all cigarettes, ashtrays, matches, and lighters in your home, car, or work. Do not let people smoke in your home.  Review your past attempts to quit. Think about what worked and what did not. GET SUPPORT AND ENCOURAGEMENT You have a better chance of being successful if you have help. You can get support in many ways.  Tell your family, friends, and co-workers that you are going to quit and need their support. Ask them not to smoke around you.  Get individual, group, or telephone counseling and support. Programs are available at local hospitals and health centers. Call your local health department for   information about programs in your area.  Spiritual beliefs and practices may help some smokers quit.  Download a "quit meter" on your computer to keep track of quit statistics, such as how long you have gone without smoking, cigarettes not smoked, and money saved.  Get a self-help book about quitting smoking and staying off of tobacco. LEARN NEW SKILLS AND BEHAVIORS  Distract yourself from urges to smoke. Talk to someone, go for a walk, or occupy your time with a task.  Change your normal routine. Take a different route to work. Drink tea instead of coffee.  Eat breakfast in a different place.  Reduce your stress. Take a hot bath, exercise, or read a book.  Plan something enjoyable to do every day. Reward yourself for not smoking.  Explore interactive web-based programs that specialize in helping you quit. GET MEDICINE AND USE IT CORRECTLY Medicines can help you stop smoking and decrease the urge to smoke. Combining medicine with the above behavioral methods and support can greatly increase your chances of successfully quitting smoking.  Nicotine replacement therapy helps deliver nicotine to your body without the negative effects and risks of smoking. Nicotine replacement therapy includes nicotine gum, lozenges, inhalers, nasal sprays, and skin patches. Some may be available over-the-counter and others require a prescription.  Antidepressant medicine helps people abstain from smoking, but how this works is unknown. This medicine is available by prescription.  Nicotinic receptor partial agonist medicine simulates the effect of nicotine in your brain. This medicine is available by prescription. Ask your caregiver for advice about which medicines to use and how to use them based on your health history. Your caregiver will tell you what side effects to look out for if you choose to be on a medicine or therapy. Carefully read the information on the package. Do not use any other product containing nicotine while using a nicotine replacement product.  RELAPSE OR DIFFICULT SITUATIONS Most relapses occur within the first 3 months after quitting. Do not be discouraged if you start smoking again. Remember, most people try several times before finally quitting. You may have symptoms of withdrawal because your body is used to nicotine. You may crave cigarettes, be irritable, feel very hungry, cough often, get headaches, or have difficulty concentrating. The withdrawal symptoms are only temporary. They are strongest when you first quit, but they will go away within  10 14 days. To reduce the chances of relapse, try to:  Avoid drinking alcohol. Drinking lowers your chances of successfully quitting.  Reduce the amount of caffeine you consume. Once you quit smoking, the amount of caffeine in your body increases and can give you symptoms, such as a rapid heartbeat, sweating, and anxiety.  Avoid smokers because they can make you want to smoke.  Do not let weight gain distract you. Many smokers will gain weight when they quit, usually less than 10 pounds. Eat a healthy diet and stay active. You can always lose the weight gained after you quit.  Find ways to improve your mood other than smoking. FOR MORE INFORMATION  www.smokefree.gov  Document Released: 05/25/2001 Document Revised: 11/30/2011 Document Reviewed: 09/09/2011 [facility] Patient Information 2013 [place], [place]. Viral Pharyngitis Viral pharyngitis is a viral infection that produces redness, pain, and swelling (inflammation) of the throat. It can spread from person to person (contagious). CAUSES Viral pharyngitis is caused by inhaling a large amount of certain germs called viruses. Many different viruses cause viral pharyngitis. SYMPTOMS Symptoms of viral pharyngitis include:  Sore throat.  Tiredness.  Stuffy nose.  Low-grade fever.  Congestion.  Cough. TREATMENT Treatment includes rest, drinking plenty of fluids, and the use of over-the-counter medication (approved by your caregiver). HOME CARE INSTRUCTIONS   Drink enough fluids to keep your urine clear or pale yellow.  Eat soft, cold foods such as ice cream, frozen ice pops, or gelatin dessert.  Gargle with warm salt water (1 tsp salt per 1 qt of water).  If over age 59, throat lozenges may be used safely.  Only take over-the-counter or prescription medicines for pain, discomfort, or fever as directed by your caregiver. Do not take aspirin. To help prevent spreading viral pharyngitis to others, avoid:  Mouth-to-mouth contact  with others.  Sharing utensils for eating and drinking.  Coughing around others. SEEK MEDICAL CARE IF:   You are better in a few days, then become worse.  You have a fever or pain not helped by pain medicines.  There are any other changes that concern you. Document Released: 03/10/2005 Document Revised: 08/23/2011 Document Reviewed: 08/06/2010 [facility] Patient Information 2013 [place], [place].

## 2012-06-27 NOTE — Progress Notes (Signed)
  Subjective:    Patient ID: Nichole Klein, female    DOB: [birth year], 47 y.o.   MRN: [id number]  HPI Very st, exposed to strep Smoker, has a mild cough, no sob,cp.   Review of Systems     Objective:   Physical Exam  Vitals reviewed. Constitutional: She is oriented to person, place, and time. She appears well-developed and well-nourished.  HENT:  Right Ear: External ear normal.  Left Ear: External ear normal.  Nose: Nose normal.  Mouth/Throat: Oropharyngeal exudate present.  Pulmonary/Chest: Effort normal.  Lymphadenopathy:    She has cervical adenopathy.  Neurological: She is alert and oriented to person, place, and time.      Results for orders placed in visit on 06/27/12  POCT RAPID STREP A (OFFICE)      Component Value Range   Rapid Strep A Screen Negative  Negative       Assessment & Plan:  Zithromax

## 2012-07-02 ENCOUNTER — Telehealth: Payer: Self-pay | Admitting: Radiology

## 2012-07-02 MED ORDER — VALACYCLOVIR HCL 1 G PO TABS: ORAL_TABLET | ORAL | Status: DC

## 2012-07-02 NOTE — Telephone Encounter (Signed)
Pt has large fever blister [facility] Nichole Klein. Formerly took Valtrex, years ago. Was sick this past week, and now would like Rx for Valtrex, if possible please advise.

## 2012-07-02 NOTE — Telephone Encounter (Signed)
Medication sent in. 

## 2012-07-10 ENCOUNTER — Ambulatory Visit: Payer: BC Managed Care – PPO

## 2012-07-10 ENCOUNTER — Ambulatory Visit (INDEPENDENT_AMBULATORY_CARE_PROVIDER_SITE_OTHER): Payer: BC Managed Care – PPO | Admitting: Family Medicine

## 2012-07-10 ENCOUNTER — Other Ambulatory Visit: Payer: Self-pay | Admitting: Family Medicine

## 2012-07-10 ENCOUNTER — Encounter: Payer: Self-pay | Admitting: Family Medicine

## 2012-07-10 VITALS — BP 131/72 | HR 81 | Temp 97.7°F | Resp 16 | Ht 61.5 in | Wt 156.0 lb

## 2012-07-10 DIAGNOSIS — Z0000 Encounter for general adult medical examination without abnormal findings: Secondary | ICD-10-CM

## 2012-07-10 DIAGNOSIS — Z720 Tobacco use: Secondary | ICD-10-CM

## 2012-07-10 DIAGNOSIS — Z01419 Encounter for gynecological examination (general) (routine) without abnormal findings: Secondary | ICD-10-CM | POA: Insufficient documentation

## 2012-07-10 DIAGNOSIS — F419 Anxiety disorder, unspecified: Secondary | ICD-10-CM

## 2012-07-10 DIAGNOSIS — Z23 Encounter for immunization: Secondary | ICD-10-CM | POA: Insufficient documentation

## 2012-07-10 LAB — TSH: TSH: 1.86 u[IU]/mL (ref 0.350–4.500)

## 2012-07-10 LAB — IFOBT (OCCULT BLOOD): IFOBT: NEGATIVE

## 2012-07-10 LAB — CBC
HCT: 44.9 % (ref 36.0–46.0)
MCHC: 34.3 g/dL (ref 30.0–36.0)
RDW: 13.1 % (ref 11.5–15.5)

## 2012-07-10 LAB — LIPID PANEL
Cholesterol: 203 mg/dL — ABNORMAL HIGH (ref 0–200)
HDL: 59 mg/dL (ref >39)
Total CHOL/HDL Ratio: 3.4 Ratio
Triglycerides: 71 mg/dL (ref <150)

## 2012-07-10 LAB — POCT URINALYSIS DIPSTICK
Bilirubin, UA: NEGATIVE
Glucose, UA: NEGATIVE
Ketones, UA: NEGATIVE
Leukocytes, UA: NEGATIVE
pH, UA: 5.5

## 2012-07-10 LAB — COMPREHENSIVE METABOLIC PANEL
ALT: 11 U/L (ref 0–35)
BUN: 14 mg/dL (ref 6–23)
CO2: 29 mEq/L (ref 19–32)
Creat: 0.67 mg/dL (ref 0.50–1.10)
Total Bilirubin: 0.4 mg/dL (ref 0.3–1.2)

## 2012-07-10 LAB — HEMOGLOBIN A1C
Hgb A1c MFr Bld: 5.7 % — ABNORMAL HIGH (ref <5.7)
Mean Plasma Glucose: 117 mg/dL — ABNORMAL HIGH (ref <117)

## 2012-07-10 NOTE — Assessment & Plan Note (Signed)
Stable; continue Xanax PRN.

## 2012-07-10 NOTE — Assessment & Plan Note (Signed)
Pre-contemplative; s/p Pneumovax.  Normal spirometry and CXR.

## 2012-07-10 NOTE — Assessment & Plan Note (Signed)
Completed; pap smear obtained; refer for mammogram.

## 2012-07-10 NOTE — Progress Notes (Signed)
[address]   [place], [place]  [address]   [phone]  Subjective:    Patient ID: Nichole Klein, female    DOB: July 17, 1965, 47 y.o.   MRN: [id number]  HPIThis 47 y.o. female presents for evaluation for CPE.    Last physical one year ago 06/2011. Pap smear 2010. Mammogram 06/2011 [place].  TDAP 2010. Influenza vaccine 03/2012. Pneumovax never.   No previous colonoscopy. Eye exam never; no glasses or contacts.  +readers. Dental exam 04/2012.  Review of Systems  Constitutional: Negative for fever, chills, diaphoresis, activity change, appetite change, fatigue and unexpected weight change.  HENT: Negative for hearing loss, ear pain, congestion, facial swelling, rhinorrhea, sneezing, mouth sores, trouble swallowing, neck pain, neck stiffness, voice change, postnasal drip, tinnitus and ear discharge.   Eyes: Negative for photophobia and visual disturbance.  Respiratory: Negative for apnea, cough, shortness of breath and wheezing.   Cardiovascular: Negative for chest pain, palpitations and leg swelling.  Gastrointestinal: Positive for constipation. Negative for nausea, vomiting, abdominal pain, diarrhea, blood in stool, anal bleeding and rectal pain.  Genitourinary: Positive for frequency. Negative for dysuria, urgency, hematuria, flank pain, vaginal bleeding, vaginal discharge, enuresis, genital sores, vaginal pain, menstrual problem and pelvic pain.  Musculoskeletal: Negative for myalgias, back pain, joint swelling, arthralgias and gait problem.  Skin: Negative for color change, pallor, rash and wound.  Neurological: Negative for dizziness, tremors, syncope, facial asymmetry, speech difficulty, weakness, light-headedness, numbness and headaches.  Hematological: Negative for adenopathy. Does not bruise/bleed easily.  Psychiatric/Behavioral: Positive for sleep disturbance. Negative for suicidal ideas, self-injury, dysphoric mood, decreased concentration and agitation. The patient is  nervous/anxious.         Past Medical History  Diagnosis Date  . Chest pain   . Anxiety   . Arthritis     DDD lumbar    Past Surgical History  Procedure Date  . Nose surgery   . Spine surgery 06/14/2008    Lumbar surgery L5-S1    Prior to Admission medications   Medication Sig Start Date End Date Taking? Authorizing Provider  ALPRAZolam Prudy Feeler) 0.5 MG tablet Take 0.5 mg by mouth at bedtime as needed.   Yes Historical Provider, MD  ibuprofen (ADVIL,MOTRIN) 200 MG tablet Take 200 mg by mouth every 6 (six) hours as needed.   Yes Historical Provider, MD  valACYclovir (VALTREX) 1000 MG tablet 2 PO Q 12 HOURS FOR 1 DAY; START WITH SYMPTOM ONSET 07/02/12  Yes Ryan M Dunn, PA-C    No Known Allergies  History   Social History  . Marital Status: Married    Spouse Name: N/A    Number of Children: N/A  . Years of Education: N/A   Occupational History  . Not on file.   Social History Main Topics  . Smoking status: Current Every Day Smoker -- 1.5 packs/day for 25 years  . Smokeless tobacco: Never Used  . Alcohol Use: No     Comment: special occasions only.  . Drug Use: No  . Sexually Active: Yes   Other Topics Concern  . Not on file   Social History Narrative   Marital status: married x 25 years; happily married; no abuse.   Children: two daughters; three grandchildren.     Employment: [employer] x 2005.     Tobacco:  1-1.5 ppd x 20 years.   Alcohol: twice yearly   Drugs: none   Exercise: sporadic.    Family History  Problem Relation Age of Onset  . Diabetes  Mother   . Hypertension Mother   . Hyperlipidemia Mother   . Hyperlipidemia Father   . Cancer Father     prostate  . Stroke Father   . Aortic stenosis Sister   . Hyperlipidemia Brother   . Diabetes Brother   . Hyperlipidemia Brother   . Arthritis Brother     Objective:   Physical Exam  Nursing note and vitals reviewed. Constitutional: She is oriented to person, place, and time. She appears well-developed and  well-nourished. No distress.  HENT:  Head: Normocephalic and atraumatic.  Right Ear: External ear normal.  Left Ear: External ear normal.  Nose: Nose normal.  Mouth/Throat: Oropharynx is clear and moist.  Eyes: Conjunctivae normal and EOM are normal. Pupils are equal, round, and reactive to light.  Neck: Normal range of motion. Neck supple. No JVD present. No thyromegaly present.  Cardiovascular: Normal rate, regular rhythm and intact distal pulses.  Exam reveals no gallop and no friction rub.   No murmur heard. Pulmonary/Chest: Effort normal and breath sounds normal. She has no wheezes. She has no rales.  Abdominal: Soft. Bowel sounds are normal. She exhibits no distension and no mass. There is no tenderness. There is no rebound and no guarding. Hernia confirmed negative in the right inguinal area and confirmed negative in the left inguinal area.  Genitourinary: Rectum normal, vagina normal and uterus normal. Rectal exam shows no external hemorrhoid, no fissure, no mass, no tenderness and anal tone normal. No breast swelling, tenderness, discharge or bleeding. Pelvic exam was performed with patient supine. There is no rash, tenderness or lesion on the right labia. There is no rash, tenderness or lesion on the left labia. Cervix exhibits no motion tenderness and no discharge. Right adnexum displays no mass, no tenderness and no fullness. Left adnexum displays no mass, no tenderness and no fullness. No erythema, tenderness or bleeding around the vagina. No vaginal discharge found.  Musculoskeletal:       Right shoulder: Normal.       Left shoulder: Normal.       Cervical back: Normal.  Lymphadenopathy:    She has no cervical adenopathy.       Right: No inguinal adenopathy present.       Left: No inguinal adenopathy present.  Neurological: She is alert and oriented to person, place, and time. She has normal reflexes. No cranial nerve deficit. She exhibits normal muscle tone. Coordination normal.    Skin: Skin is warm and dry. No rash noted. She is not diaphoretic. No erythema. No pallor.  Psychiatric: She has a normal mood and affect. Her behavior is normal. Judgment and thought content normal.   Results for orders placed in visit on 07/10/12  POCT URINALYSIS DIPSTICK      Component Value Range   Color, UA yellow     Clarity, UA clear     Glucose, UA neg     Bilirubin, UA neg     Ketones, UA neg     Spec Grav, UA >=1.030     Blood, UA small     pH, UA 5.5     Protein, UA neg     Urobilinogen, UA 1.0     Nitrite, UA neg     Leukocytes, UA Negative    IFOBT (OCCULT BLOOD)      Component Value Range   IFOBT Negative      EKG: NSR; NO ST CHANGES.      [employer] reading (PRIMARY) by  Dr. Katrinka Blazing.  CXR:NAD.  SPIROMETRY:  FVC 83%; FEV1  84%; FEV1/FVC % 101% --- NORMAL SPIROMETRY.   Assessment & Plan:   1. Routine general medical examination at a health care facility  EKG 12-Lead, DG Chest 2 View, Spirometry with graph, CBC, Hemoglobin A1c, Comprehensive metabolic panel, POCT urinalysis dipstick, Lipid panel, TSH, Vitamin B12, Vitamin D 25 hydroxy, IFOBT POC (occult bld, rslt in office)  2. Routine general medical examination at a health care facility Active EKG 12-Lead, DG Chest 2 View, Spirometry with graph, CBC, Hemoglobin A1c, Comprehensive metabolic panel, POCT urinalysis dipstick, Lipid panel, TSH, Vitamin B12, Vitamin D 25 hydroxy, IFOBT POC (occult bld, rslt in office)  3. Routine gynecological examination  MM Digital Screening, Pap IG and HPV (high risk) DNA detection  4. Need for prophylactic vaccination against Streptococcus pneumoniae (pneumococcus)

## 2012-07-10 NOTE — Assessment & Plan Note (Signed)
Administered due to smoking.

## 2012-07-10 NOTE — Assessment & Plan Note (Signed)
Anticipatory guidance --- weight loss, exercise, smoking cessation. Pap smear obtained. Refer for mammogram.  Hemosure negative; s/p Pneumovax due to smoking hx.  Obtain labs.

## 2012-07-10 NOTE — Patient Instructions (Addendum)
1. Routine general medical examination at a health care facility  EKG 12-Lead, DG Chest 2 View, Spirometry with graph, CBC, Hemoglobin A1c, Comprehensive metabolic panel, POCT urinalysis dipstick, Lipid panel, TSH, Vitamin B12, Vitamin D 25 hydroxy, IFOBT POC (occult bld, rslt in office)  2. Routine general medical examination at a health care facility Active EKG 12-Lead, DG Chest 2 View, Spirometry with graph, CBC, Hemoglobin A1c, Comprehensive metabolic panel, POCT urinalysis dipstick, Lipid panel, TSH, Vitamin B12, Vitamin D 25 hydroxy, IFOBT POC (occult bld, rslt in office)  3. Routine gynecological examination  MM Digital Screening, Pap IG and HPV (high risk) DNA detection  4. Need for prophylactic vaccination against Streptococcus pneumoniae (pneumococcus)

## 2012-07-11 ENCOUNTER — Ambulatory Visit (INDEPENDENT_AMBULATORY_CARE_PROVIDER_SITE_OTHER): Payer: BC Managed Care – PPO | Admitting: Emergency Medicine

## 2012-07-11 VITALS — BP 118/68 | HR 120 | Temp 100.4°F | Resp 16

## 2012-07-11 DIAGNOSIS — R509 Fever, unspecified: Secondary | ICD-10-CM

## 2012-07-11 DIAGNOSIS — M79609 Pain in unspecified limb: Secondary | ICD-10-CM

## 2012-07-11 DIAGNOSIS — R059 Cough, unspecified: Secondary | ICD-10-CM

## 2012-07-11 DIAGNOSIS — T7840XA Allergy, unspecified, initial encounter: Secondary | ICD-10-CM

## 2012-07-11 DIAGNOSIS — J301 Allergic rhinitis due to pollen: Secondary | ICD-10-CM

## 2012-07-11 DIAGNOSIS — R05 Cough: Secondary | ICD-10-CM

## 2012-07-11 LAB — POCT INFLUENZA A/B: Influenza B, POC: NEGATIVE

## 2012-07-11 LAB — VITAMIN D 25 HYDROXY (VIT D DEFICIENCY, FRACTURES): Vit D, 25-Hydroxy: 31 ng/mL (ref 30–89)

## 2012-07-11 MED ORDER — LEVALBUTEROL TARTRATE 45 MCG/ACT IN AERO: 2.0000 | INHALATION_SPRAY | RESPIRATORY_TRACT | Status: DC | PRN

## 2012-07-11 MED ORDER — EPINEPHRINE 0.3 MG/0.3ML IJ DEVI: 0.3000 mg | Freq: Once | INTRAMUSCULAR | Status: DC

## 2012-07-11 MED ORDER — HYDROCODONE-HOMATROPINE 5-1.5 MG/5ML PO SYRP: 5.0000 mL | ORAL_SOLUTION | Freq: Three times a day (TID) | ORAL | Status: DC | PRN

## 2012-07-11 NOTE — Patient Instructions (Addendum)

## 2012-07-11 NOTE — Addendum Note (Signed)
Addended by: Lesle Chris A on: 07/11/2012 10:56 AM   Modules accepted: Orders

## 2012-07-11 NOTE — Addendum Note (Signed)
Addended by: Cira Rue R on: 07/11/2012 11:00 AM   Modules accepted: Orders

## 2012-07-11 NOTE — Progress Notes (Addendum)
  Subjective:    Patient ID: Nichole Klein, female    DOB: 03/02/1966, 47 y.o.   MRN: [id number]  HPI patient seen yesterday for her regular physical. She received her pneumococcal vaccination because of her smoking history. Last night she started having significant pain in her right shoulder inability to lift her right shoulder and developed a dry nonproductive cough. She has not had any chills .    Review of Systems     Objective:   Physical Exam patient is alert and cooperative but has a frequent dry cough. Her neck is supple chest exam revealed no wheezes exchange was excellent. Heart was regular rate without murmurs. There is exquisite tenderness over the right shoulder with swelling. There are a few flat red macular areas around the shoulder itself. She is unable to abduct the right arm.  Results for orders placed in visit on 07/11/12  POCT INFLUENZA A/B      Component Value Range   Influenza A, POC Negative     Influenza B, POC Negative          Assessment & Plan:  Patient having local and systemic symptoms related to her pneumococcal vaccine. We'll treat with ice ibuprofen she is given Hycodan to have her cough. She does not want to start on prednisone or use an inhaler at the present time. I did convince her to get a Xopenex inhaler 2 puffs every 4-6 hours as needed. I did review with her how to use an EpiPen and if she had worsening in her respiratory symptoms she was to call 911 and use the EpiPen I repeated her pulse ox prior to leaving and it was 96% initially 95 increased to 96%. This was with a heart rate of 120. A flu test was obtained prior to leaving.

## 2012-07-13 LAB — PAP, THIN PREP W/HPV RFLX HPV TYPE 16/18: HPV DNA High Risk: NOT DETECTED

## 2012-07-17 ENCOUNTER — Ambulatory Visit (INDEPENDENT_AMBULATORY_CARE_PROVIDER_SITE_OTHER): Payer: BC Managed Care – PPO | Admitting: Family Medicine

## 2012-07-17 ENCOUNTER — Ambulatory Visit: Payer: BC Managed Care – PPO

## 2012-07-17 ENCOUNTER — Encounter: Payer: Self-pay | Admitting: Family Medicine

## 2012-07-17 VITALS — BP 109/73 | HR 88 | Temp 98.0°F | Resp 16

## 2012-07-17 DIAGNOSIS — J209 Acute bronchitis, unspecified: Secondary | ICD-10-CM

## 2012-07-17 DIAGNOSIS — J9801 Acute bronchospasm: Secondary | ICD-10-CM

## 2012-07-17 DIAGNOSIS — R05 Cough: Secondary | ICD-10-CM

## 2012-07-17 DIAGNOSIS — R059 Cough, unspecified: Secondary | ICD-10-CM

## 2012-07-17 LAB — POCT CBC
Granulocyte percent: 62.2 %G (ref 37–80)
HCT, POC: 41.5 % (ref 37.7–47.9)
MID (cbc): 0.8 (ref 0–0.9)
POC Granulocyte: 5.9 (ref 2–6.9)
POC LYMPH PERCENT: 29.1 %L (ref 10–50)
Platelet Count, POC: 335 10*3/uL (ref 142–424)
RDW, POC: 13.3 %

## 2012-07-17 MED ORDER — METHYLPREDNISOLONE ACETATE 80 MG/ML IJ SUSP
80.0000 mg | Freq: Once | INTRAMUSCULAR | Status: AC
  Administered 2012-07-17: 80 mg via INTRAMUSCULAR

## 2012-07-17 MED ORDER — AZITHROMYCIN 250 MG PO TABS: ORAL_TABLET | ORAL | Status: DC

## 2012-07-17 NOTE — Progress Notes (Signed)
[address]   [place], [place]  [address]   [phone]  Subjective:    Patient ID: Nichole Klein, female    DOB: 1966-04-16, 47 y.o.   MRN: [id number]  HPIThis 47 y.o. female presents for evaluation of cough. Onset one week ago after receiving Pneumovax for chronic smoking use.   After receiving Pneumovax, developed localized swelling at injection site with pain.  Three hours later, developed cough and then later that day fever.  Fever for five days; last fever 24 hours ago.  +chills.  +malaise/fatigue.  No headache, ear pain, sore throat; +mild nasal congestion; no rhinorrhea; +coughing; no sputum production; +wheezing; +SOB.  Using Xopenex every 8 hours; using rx cough suppressant as prescribed.  Not sleeping due to cough.  Evaluated by Dr. Cleta Alberts one week ago; rx for Xopenex and cough medication provided.  S/p flu vaccine this season.     Review of Systems  Constitutional: Positive for fever, chills and fatigue.  HENT: Positive for congestion and voice change. Negative for ear pain, sore throat, rhinorrhea, sneezing, trouble swallowing, postnasal drip and sinus pressure.   Respiratory: Positive for cough, chest tightness, shortness of breath and wheezing.   Gastrointestinal: Negative for nausea, vomiting and diarrhea.  Skin: Negative for rash.        Past Medical History  Diagnosis Date  . Anxiety   . Arthritis     DDD lumbar  . Herpes labialis     Past Surgical History  Procedure Date  . Nose surgery   . Spine surgery 06/14/2008    Lumbar surgery L5-S1    Prior to Admission medications   Medication Sig Start Date End Date Taking? Authorizing Provider  ALPRAZolam Prudy Feeler) 0.5 MG tablet Take 0.5 mg by mouth at bedtime as needed.    Historical Provider, MD  EPINEPHrine (EPI-PEN) 0.3 mg/0.3 mL DEVI Inject 0.3 mLs (0.3 mg total) into the muscle once. 07/11/12   Collene Gobble, MD  HYDROcodone-homatropine [facility]) 5-1.5 MG/5ML syrup Take 5 mLs by mouth every 8 (eight) hours as  needed for cough. 07/11/12   Collene Gobble, MD  ibuprofen (ADVIL,MOTRIN) 200 MG tablet Take 200 mg by mouth every 6 (six) hours as needed.    Historical Provider, MD  levalbuterol [facility] HFA) 45 MCG/ACT inhaler Inhale 2 puffs into the lungs every 4 (four) hours as needed for wheezing. 07/11/12   Collene Gobble, MD  valACYclovir (VALTREX) 1000 MG tablet 2 PO Q 12 HOURS FOR 1 DAY; START WITH SYMPTOM ONSET 07/02/12   Sondra Barges, PA-C    No Known Allergies  History   Social History  . Marital Status: Married    Spouse Name: N/A    Number of Children: N/A  . Years of Education: N/A   Occupational History  . Not on file.   Social History Main Topics  . Smoking status: Current Every Day Smoker -- 1.5 packs/day for 25 years  . Smokeless tobacco: Never Used  . Alcohol Use: No     Comment: special occasions only.  . Drug Use: No  . Sexually Active: Yes    Birth Control/ Protection: Surgical     Comment: Husband with vasectomy.   Other Topics Concern  . Not on file   Social History Narrative   Marital status: married x 25 years; happily married; no abuse.   Children: two daughters; three grandchildren.     Employment: [employer] x 2005.     Tobacco:  1-1.5 ppd x  20 years.   Alcohol: twice yearly   Drugs: none   Exercise: sporadic.    Family History  Problem Relation Age of Onset  . Diabetes Mother   . Hypertension Mother   . Hyperlipidemia Mother   . Hyperlipidemia Father   . Cancer Father     prostate  . Stroke Father   . Aortic stenosis Sister   . Hyperlipidemia Brother   . Diabetes Brother   . Hyperlipidemia Brother   . Arthritis Brother     Objective:   Physical Exam  Constitutional: She is oriented to person, place, and time. She appears well-developed and well-nourished.  HENT:  Head: Normocephalic and atraumatic.  Right Ear: External ear normal.  Left Ear: External ear normal.  Nose: Nose normal.  Mouth/Throat: Oropharynx is clear and moist.  Eyes: Conjunctivae  normal are normal. Pupils are equal, round, and reactive to light.  Neck: Normal range of motion. Neck supple.  Cardiovascular: Normal rate, regular rhythm and normal heart sounds.   No murmur heard.      Rate 88  Pulmonary/Chest: No respiratory distress. She has no decreased breath sounds. She has wheezes in the right upper field, the right middle field, the right lower field, the left upper field, the left middle field and the left lower field. She has rhonchi in the right upper field, the right middle field, the right lower field, the left upper field, the left middle field and the left lower field. She has no rales.  Lymphadenopathy:    She has no cervical adenopathy.  Neurological: She is alert and oriented to person, place, and time.  Skin: Skin is warm and dry. No rash noted. No erythema.  Psychiatric: She has a normal mood and affect. Her behavior is normal.    [facility] reading (PRIMARY) by  Dr. Katrinka Blazing.  CXR: NAD.  Results for orders placed in visit on 07/17/12  POCT CBC      Component Value Range   WBC 9.5  4.6 - 10.2 K/uL   Lymph, poc 2.8  0.6 - 3.4   POC LYMPH PERCENT 29.1  10 - 50 %L   MID (cbc) 0.8  0 - 0.9   POC MID % 8.7  0 - 12 %M   POC Granulocyte 5.9  2 - 6.9   Granulocyte percent 62.2  37 - 80 %G   RBC 4.31  4.04 - 5.48 M/uL   Hemoglobin 13.5  12.2 - 16.2 g/dL   HCT, POC 16.1  09.6 - 47.9 %   MCV 96.2  80 - 97 fL   MCH, POC 31.3 (*) 27 - 31.2 pg   MCHC 32.5  31.8 - 35.4 g/dL   RDW, POC 04.5     Platelet Count, POC 335  142 - 424 K/uL   MPV 9.4  0 - 99.8 fL       Assessment & Plan:   1. Cough  DG Chest 2 View, POCT CBC  2. Bronchospasm with bronchitis, acute    3. Bronchospasm  methylPREDNISolone acetate (DEPO-MEDROL) injection 80 mg    1.  Bronchospasm:  New.  S/p Depomedrol 80mg  IM in office; pt declined rx for Prednisone or Xopenex neb in office.  Continue Xopenex HFA every 8 hours PRN.   2.  Acute bronchitis:  New. Onset after Pneumovax last week.  Normal  WBC count; flu swab negative last week.  Continue cough medication.

## 2012-07-24 NOTE — Progress Notes (Signed)
Reviewed and agree.

## 2012-08-30 ENCOUNTER — Ambulatory Visit: Payer: Self-pay | Admitting: Family Medicine

## 2012-09-14 ENCOUNTER — Encounter: Payer: Self-pay | Admitting: Family Medicine

## 2012-11-22 ENCOUNTER — Encounter: Payer: Self-pay | Admitting: Family Medicine

## 2013-02-10 ENCOUNTER — Encounter: Payer: Self-pay | Admitting: Family Medicine

## 2013-02-22 ENCOUNTER — Ambulatory Visit: Payer: BC Managed Care – PPO | Admitting: Family Medicine

## 2013-02-22 ENCOUNTER — Ambulatory Visit: Payer: BC Managed Care – PPO

## 2013-02-22 VITALS — BP 132/80 | HR 80 | Temp 98.8°F | Resp 18 | Wt 163.8 lb

## 2013-02-22 DIAGNOSIS — R110 Nausea: Secondary | ICD-10-CM

## 2013-02-22 DIAGNOSIS — R1013 Epigastric pain: Secondary | ICD-10-CM

## 2013-02-22 DIAGNOSIS — K5900 Constipation, unspecified: Secondary | ICD-10-CM

## 2013-02-22 LAB — POCT CBC
HCT, POC: 44.8 % (ref 37.7–47.9)
Hemoglobin: 14.4 g/dL (ref 12.2–16.2)
Lymph, poc: 2.3 (ref 0.6–3.4)
MCH, POC: 32.3 pg — AB (ref 27–31.2)
MCHC: 32.1 g/dL (ref 31.8–35.4)
MCV: 100.5 fL — AB (ref 80–97)
WBC: 8.6 10*3/uL (ref 4.6–10.2)

## 2013-02-22 LAB — COMPREHENSIVE METABOLIC PANEL
AST: 15 U/L (ref 0–37)
Albumin: 3.9 g/dL (ref 3.5–5.2)
BUN: 10 mg/dL (ref 6–23)
Calcium: 9.1 mg/dL (ref 8.4–10.5)
Chloride: 104 mEq/L (ref 96–112)
Glucose, Bld: 111 mg/dL — ABNORMAL HIGH (ref 70–99)
Potassium: 3.4 mEq/L — ABNORMAL LOW (ref 3.5–5.3)

## 2013-02-22 LAB — POCT URINALYSIS DIPSTICK
Bilirubin, UA: NEGATIVE
Ketones, UA: NEGATIVE
Leukocytes, UA: NEGATIVE
pH, UA: 5.5

## 2013-02-22 MED ORDER — OMEPRAZOLE 20 MG PO CPDR: 20.0000 mg | DELAYED_RELEASE_CAPSULE | Freq: Every day | ORAL | Status: DC

## 2013-02-22 NOTE — Progress Notes (Signed)
[address]   [place], [place]  [address]   [phone]  Subjective:    Patient ID: Nichole Klein, female    DOB: [birth year], 47 y.o.   MRN: [id number]  HPI This 47 y.o. female presents for evaluation of upper abdominal pain.  Onset two days ago.  Having abdominal cramping from RUQ to LUQ.  History of constipation.  Worried about constipation.  Pain all day yesterday.  Went home and went to sleep.  Took 3 hour nap.  Still have abdominal pain. Took Dulcolax laxative 5mg  yesterday.  Had 5 stools today; no diarrhea; first stool normal; next four stools really really soft.  Large amounts of stool for first three stools; the next two stools were smaller.  Lots of gas in past hour.  Eating makes the pain improve.  Ate small banana this morning.  Hungry at 12:00 noon; got bacon egg n biscuit at lunch with worsening pain today.  Worried also about gallbladder.  Multiple stools did not help the abdominal pain yet pain was some better.  Eating made much worse.  No fever/chills/sweats; no malaise or fatigue.  Feels bloated; abdomen distended.  Looked pregnant two nights ago.  No urinary symptoms; just completed menses.  Some belching; trying to make self belch.  Nauseated after lunch severely; no vomiting.  Nausea upon awakening this morning.  Some burning sensation.  When stomach gets empty, pain gets worse; usually eating makes it feel better.    Review of Systems  Constitutional: Positive for fatigue. Negative for fever, chills and diaphoresis.  Gastrointestinal: Positive for nausea, abdominal pain, constipation and abdominal distention. Negative for vomiting, diarrhea, blood in stool, anal bleeding and rectal pain.  Genitourinary: Negative for dysuria, urgency, hematuria and pelvic pain.   Past Medical History  Diagnosis Date  . Anxiety   . Arthritis     DDD lumbar  . Herpes labialis    Past Surgical History  Procedure Laterality Date  . Nose surgery    . Spine surgery  06/14/2008    Lumbar  surgery L5-S1   No Known Allergies Current Outpatient Prescriptions on File Prior to Visit  Medication Sig Dispense Refill  . ibuprofen (ADVIL,MOTRIN) 200 MG tablet Take 200 mg by mouth every 6 (six) hours as needed.      . valACYclovir (VALTREX) 1000 MG tablet 2 PO Q 12 HOURS FOR 1 DAY; START WITH SYMPTOM ONSET  30 tablet  2  . ALPRAZolam (XANAX) 0.5 MG tablet Take 0.5 mg by mouth at bedtime as needed.      Marland Kitchen azithromycin (ZITHROMAX) 250 MG tablet Take 2 tabs PO x 1 dose, then 1 tab PO QD x 4 days  6 tablet  0  . EPINEPHrine (EPI-PEN) 0.3 mg/0.3 mL DEVI Inject 0.3 mLs (0.3 mg total) into the muscle once.  1 Device  5  . HYDROcodone-homatropine (HYCODAN) 5-1.5 MG/5ML syrup Take 5 mLs by mouth every 8 (eight) hours as needed for cough.  120 mL  0  . levalbuterol (XOPENEX HFA) 45 MCG/ACT inhaler Inhale 2 puffs into the lungs every 4 (four) hours as needed for wheezing.  1 Inhaler  12   No current facility-administered medications on file prior to visit.       Objective:   Physical Exam  Nursing note and vitals reviewed. Constitutional: She is oriented to person, place, and time. She appears well-developed and well-nourished. No distress.  HENT:  Head: Normocephalic and atraumatic.  Mouth/Throat: Oropharynx is clear and moist.  Eyes: Conjunctivae are normal. Pupils are equal, round, and reactive to light.  Neck: Normal range of motion. Neck supple.  Cardiovascular: Normal rate, regular rhythm and normal heart sounds.   No murmur heard. Pulmonary/Chest: Effort normal and breath sounds normal.  Abdominal: Soft. Bowel sounds are normal. She exhibits no distension and no mass. There is no hepatosplenomegaly. There is tenderness in the epigastric area. There is no rebound, no guarding and no CVA tenderness. No hernia.  Neurological: She is alert and oriented to person, place, and time.  Skin: Skin is warm and dry. No rash noted. She is not diaphoretic.  Psychiatric: She has a normal mood and  affect. Her behavior is normal.    Results for orders placed in visit on 02/22/13  POCT CBC      Result Value Range   WBC 8.6  4.6 - 10.2 K/uL   Lymph, poc 2.3  0.6 - 3.4   POC LYMPH PERCENT 26.9  10 - 50 %L   MID (cbc) 0.5  0 - 0.9   POC MID % 5.8  0 - 12 %M   POC Granulocyte 5.8  2 - 6.9   Granulocyte percent 67.3  37 - 80 %G   RBC 4.46  4.04 - 5.48 M/uL   Hemoglobin 14.4  12.2 - 16.2 g/dL   HCT, POC 29.5  62.1 - 47.9 %   MCV 100.5 (*) 80 - 97 fL   MCH, POC 32.3 (*) 27 - 31.2 pg   MCHC 32.1  31.8 - 35.4 g/dL   RDW, POC 30.8     Platelet Count, POC 296  142 - 424 K/uL   MPV 9.8  0 - 99.8 fL  POCT URINALYSIS DIPSTICK      Result Value Range   Color, UA yellow     Clarity, UA clear     Glucose, UA neg     Bilirubin, UA neg     Ketones, UA neg     Spec Grav, UA >=1.030     Blood, UA mod     pH, UA 5.5     Protein, UA neg     Urobilinogen, UA 0.2     Nitrite, UA neg     Leukocytes, UA Negative    POCT URINE PREGNANCY      Result Value Range   Preg Test, Ur Negative     [facility] reading (PRIMARY) by  Dr. Katrinka Blazing.  AAS: no free air; NAD.     Assessment & Plan:  Abdominal pain, epigastric - Plan: POCT CBC, POCT urinalysis dipstick, POCT urine pregnancy, Comprehensive metabolic panel, DG Abd Acute W/Chest  Unspecified constipation  Nausea alone   1.  Abdominal pain epigastric:  New.  Obtain labs; if LFTs abnormal, will obtain abdominal u/s to evaluate for gallbladder pathology.  Treat empirically with Zantac 150mg  bid.  Dietary modification for the next 1-2 weeks; avoid spicy foods, caffeinated beverages.  Recommend bland diet, hydration.  If persists or worsens, obtain abdominal u/s and refer to GI for EGD.  Rx for Omeprazole sent to pharmacy if Zantac does not improve symptoms. 2. Constipation:  Worsening recently; improved with suppository; likely contributing to recent abdominal pain. 3.  Nausea: New.  BRAT diet, hydration.  Start Zantac 150mg  bid for underlying gastritis  versus GERD versus PUD.  Meds ordered this encounter  Medications  . omeprazole (PRILOSEC) 20 MG capsule    Sig: Take 1 capsule (20 mg total) by mouth daily.    Dispense:  30 capsule    Refill:  3

## 2013-02-22 NOTE — Patient Instructions (Addendum)
1.  TAKE OTC ACID REDUCER AT BEDTIME FOR 1-2 WEEKS. 2.  TAKE OMEPRAZOLE 20MG  ONE TABLET DALY FOR 2-4 WEEKS.

## 2013-02-27 ENCOUNTER — Telehealth: Payer: Self-pay | Admitting: *Deleted

## 2013-02-27 NOTE — Telephone Encounter (Signed)
Patient called to ask about cmet results because she can't remember password to mychart, and no one from office has called yet.

## 2013-03-01 ENCOUNTER — Encounter: Payer: Self-pay | Admitting: Family Medicine

## 2013-03-06 ENCOUNTER — Telehealth: Payer: Self-pay | Admitting: Family Medicine

## 2013-03-06 NOTE — Telephone Encounter (Signed)
Patient called to get lab results and i gave her message that Dr Katrinka Blazing had written patient states that she is doing well

## 2013-03-26 ENCOUNTER — Telehealth: Payer: Self-pay | Admitting: Emergency Medicine

## 2013-03-26 ENCOUNTER — Telehealth: Payer: Self-pay | Admitting: *Deleted

## 2013-03-26 ENCOUNTER — Other Ambulatory Visit: Payer: Self-pay | Admitting: Emergency Medicine

## 2013-03-26 MED ORDER — AMOXICILLIN 875 MG PO TABS: 875.0000 mg | ORAL_TABLET | Freq: Two times a day (BID) | ORAL | Status: DC

## 2013-03-26 NOTE — Telephone Encounter (Signed)
Spoke with patient. She has an infected molar on the left lower jaw. Placed on amoxicillin.

## 2013-03-26 NOTE — Telephone Encounter (Signed)
At 10:30 am called prescription to voice mail at [facility] on [address] and [place] for amoxicillin 875 mg tablet, per Dr Cleta Alberts. Patient Advised.

## 2013-04-19 ENCOUNTER — Other Ambulatory Visit: Payer: Self-pay

## 2013-04-20 ENCOUNTER — Telehealth: Payer: Self-pay

## 2013-04-20 NOTE — Telephone Encounter (Signed)
Have given copy to patient. She should be able to view on Mychart.

## 2013-04-20 NOTE — Telephone Encounter (Signed)
Pt requested a copy of her lab results from her last CPE

## 2013-06-18 ENCOUNTER — Encounter: Payer: Self-pay | Admitting: Family Medicine

## 2013-06-18 ENCOUNTER — Ambulatory Visit (INDEPENDENT_AMBULATORY_CARE_PROVIDER_SITE_OTHER): Payer: BC Managed Care – PPO | Admitting: Family Medicine

## 2013-06-18 ENCOUNTER — Ambulatory Visit: Payer: BC Managed Care – PPO

## 2013-06-18 VITALS — BP 149/77 | HR 89 | Temp 98.2°F | Resp 18 | Ht 62.5 in | Wt 165.0 lb

## 2013-06-18 DIAGNOSIS — R05 Cough: Secondary | ICD-10-CM

## 2013-06-18 DIAGNOSIS — R059 Cough, unspecified: Secondary | ICD-10-CM

## 2013-06-18 DIAGNOSIS — J111 Influenza due to unidentified influenza virus with other respiratory manifestations: Secondary | ICD-10-CM

## 2013-06-18 DIAGNOSIS — J209 Acute bronchitis, unspecified: Secondary | ICD-10-CM

## 2013-06-18 LAB — POCT CBC
Granulocyte percent: 78 %G (ref 37–80)
HCT, POC: 47 % (ref 37.7–47.9)
HEMOGLOBIN: 14.7 g/dL (ref 12.2–16.2)
LYMPH, POC: 2.2 (ref 0.6–3.4)
MCH: 31.7 pg — AB (ref 27–31.2)
MCHC: 31.3 g/dL — AB (ref 31.8–35.4)
MCV: 101.2 fL — AB (ref 80–97)
MID (cbc): 0.7 (ref 0–0.9)
MPV: 9.8 fL (ref 0–99.8)
POC Granulocyte: 10.5 — AB (ref 2–6.9)
POC LYMPH PERCENT: 16.5 %L (ref 10–50)
POC MID %: 5.5 % (ref 0–12)
Platelet Count, POC: 273 10*3/uL (ref 142–424)
RBC: 4.64 M/uL (ref 4.04–5.48)
RDW, POC: 14.2 %
WBC: 13.4 10*3/uL — AB (ref 4.6–10.2)

## 2013-06-18 MED ORDER — AMOXICILLIN-POT CLAVULANATE 875-125 MG PO TABS: 1.0000 | ORAL_TABLET | Freq: Two times a day (BID) | ORAL | Status: DC

## 2013-06-18 NOTE — Progress Notes (Signed)
Subjective:    Patient ID: Nichole Klein, female    DOB: Mar 18, 1966, 48 y.o.   MRN: [id number]  HPI This 48 y.o. female presents for evaluation of flu like symptoms and concern for pneumonia.  Onset of symptoms seven days ago.  +fever Tmax 103; +chills/sweats.  +HA.  No ear pain.  +ST; no pain with swallowing.  +rhinorrhea and nasal congestion; +cough; +wheezing intermittent.  No SOB.  +L sided back pain similar to previous pneumonia.  No v/d but frequent b.m.s yesterday.  Taking Advil only.  +exposure to flu last week at work.  +sinus pressure  Review of Systems  Constitutional: Positive for fever, chills, diaphoresis and fatigue.  HENT: Positive for congestion, postnasal drip, rhinorrhea, sinus pressure, sore throat and voice change. Negative for ear pain and trouble swallowing.   Respiratory: Positive for cough and wheezing. Negative for shortness of breath.   Gastrointestinal: Negative for nausea, vomiting and diarrhea.  Neurological: Positive for headaches. Negative for dizziness and light-headedness.   Past Medical History  Diagnosis Date  . Anxiety   . Arthritis     DDD lumbar  . Herpes labialis    No Known Allergies Current Outpatient Prescriptions on File Prior to Visit  Medication Sig Dispense Refill  . ibuprofen (ADVIL,MOTRIN) 200 MG tablet Take 200 mg by mouth every 6 (six) hours as needed.      Marland Kitchen omeprazole (PRILOSEC) 20 MG capsule Take 1 capsule (20 mg total) by mouth daily.  30 capsule  3  . valACYclovir (VALTREX) 1000 MG tablet 2 PO Q 12 HOURS FOR 1 DAY; START WITH SYMPTOM ONSET  30 tablet  2   No current facility-administered medications on file prior to visit.   History   Social History  . Marital Status: Married    Spouse Name: [name]    Number of Children: 2  . Years of Education: 15   Occupational History  . CMA    Social History Main Topics  . Smoking status: Current Every Day Smoker -- 1.50 packs/day for 25 years  . Smokeless tobacco: Never Used    . Alcohol Use: No     Comment: special occasions only.  . Drug Use: No  . Sexual Activity: Yes    Birth Control/ Protection: Surgical     Comment: Husband with vasectomy.   Other Topics Concern  . Not on file   Social History Narrative   Marital status: married x 25 years; happily married; no abuse.      Children: two daughters; three grandchildren.        Employment: [employer] x 2005.        Tobacco:  1-1.5 ppd x 20 years.      Alcohol: twice yearly      Drugs: none      Exercise: sporadic.       Objective:   Physical Exam  Nursing note and vitals reviewed. Constitutional: She is oriented to person, place, and time. She appears well-developed and well-nourished. No distress.  HENT:  Head: Normocephalic and atraumatic.  Right Ear: External ear normal.  Left Ear: External ear normal.  Nose: Right sinus exhibits maxillary sinus tenderness. Right sinus exhibits no frontal sinus tenderness. Left sinus exhibits maxillary sinus tenderness. Left sinus exhibits no frontal sinus tenderness.  Eyes: Conjunctivae and EOM are normal. Pupils are equal, round, and reactive to light.  Neck: Normal range of motion. Neck supple.  Cardiovascular: Normal rate, regular rhythm and normal heart sounds.   No murmur  heard. Pulmonary/Chest: Effort normal and breath sounds normal. No respiratory distress.  Upper airway congestion that clears with coughing.    Lymphadenopathy:    She has cervical adenopathy.  Neurological: She is alert and oriented to person, place, and time.  Skin: She is not diaphoretic.  Psychiatric: She has a normal mood and affect. Her behavior is normal.    Results for orders placed in visit on 06/18/13  POCT CBC      Result Value Range   WBC 13.4 (*) 4.6 - 10.2 K/uL   Lymph, poc 2.2  0.6 - 3.4   POC LYMPH PERCENT 16.5  10 - 50 %L   MID (cbc) 0.7  0 - 0.9   POC MID % 5.5  0 - 12 %M   POC Granulocyte 10.5 (*) 2 - 6.9   Granulocyte percent 78.0  37 - 80 %G   RBC 4.64  4.04  - 5.48 M/uL   Hemoglobin 14.7  12.2 - 16.2 g/dL   HCT, POC 09.847.0  11.937.7 - 47.9 %   MCV 101.2 (*) 80 - 97 fL   MCH, POC 31.7 (*) 27 - 31.2 pg   MCHC 31.3 (*) 31.8 - 35.4 g/dL   RDW, POC 14.714.2     Platelet Count, POC 273  142 - 424 K/uL   MPV 9.8  0 - 99.8 fL   [facility] reading (PRIMARY) by  Dr. Katrinka BlazingSmith. CXR:  NAD      Assessment & Plan:  Cough - Plan: DG Chest 2 View  Influenza with other respiratory manifestations - Plan: POCT CBC  Acute bronchitis  1.  Influenza:  New.  Suspect influenza with secondary infection; onset seven days ago yet with persistent fever.  Recommend rest, fluids, Ibuprofen. 2.  Acute bronchitis/sinusitis:  New.  Rx for Augmentin provided.  Recommend OTC Mucinex PRN.  RTC for acute worsening.  Meds ordered this encounter  Medications  . amoxicillin-clavulanate (AUGMENTIN) 875-125 MG per tablet    Sig: Take 1 tablet by mouth 2 (two) times daily.    Dispense:  20 tablet    Refill:  0   Nilda SimmerKristi Maciej Schweitzer, M.D.  [facility]  [place] [address] [place], [place]  [phone] phone [phone] fax

## 2013-07-09 ENCOUNTER — Other Ambulatory Visit: Payer: Self-pay | Admitting: Radiology

## 2013-07-09 DIAGNOSIS — Z0000 Encounter for general adult medical examination without abnormal findings: Secondary | ICD-10-CM

## 2013-07-09 NOTE — Addendum Note (Signed)
Addended byCaffie Damme: LITTRELL, AMY W on: 07/09/2013 01:52 PM   Modules accepted: Orders

## 2013-07-11 ENCOUNTER — Ambulatory Visit (INDEPENDENT_AMBULATORY_CARE_PROVIDER_SITE_OTHER): Payer: BC Managed Care – PPO | Admitting: Family Medicine

## 2013-07-11 ENCOUNTER — Encounter: Payer: Self-pay | Admitting: Family Medicine

## 2013-07-11 VITALS — BP 130/76 | HR 82 | Resp 18 | Ht 62.0 in | Wt 160.0 lb

## 2013-07-11 DIAGNOSIS — Z0000 Encounter for general adult medical examination without abnormal findings: Secondary | ICD-10-CM

## 2013-07-11 DIAGNOSIS — Z01419 Encounter for gynecological examination (general) (routine) without abnormal findings: Secondary | ICD-10-CM

## 2013-07-11 DIAGNOSIS — Z720 Tobacco use: Secondary | ICD-10-CM

## 2013-07-11 LAB — POCT UA - MICROSCOPIC ONLY
Casts, Ur, LPF, POC: NEGATIVE
Crystals, Ur, HPF, POC: NEGATIVE
Mucus, UA: POSITIVE
YEAST UA: NEGATIVE

## 2013-07-11 LAB — IFOBT (OCCULT BLOOD): IFOBT: NEGATIVE

## 2013-07-11 LAB — POCT URINALYSIS DIPSTICK
BILIRUBIN UA: NEGATIVE
Glucose, UA: NEGATIVE
Ketones, UA: NEGATIVE
Leukocytes, UA: NEGATIVE
NITRITE UA: NEGATIVE
PROTEIN UA: NEGATIVE
Spec Grav, UA: 1.015
Urobilinogen, UA: 1
pH, UA: 7

## 2013-07-11 NOTE — Progress Notes (Signed)
Subjective:    Patient ID: Nichole Klein, female    DOB: 07-13-1965, 48 y.o.   MRN: [id number]  HPI This 48 y.o. female presents for Complete Physical Examination.  Last physical 07/09/12. Pap smear 07/09/12. WNL; no HPV.  Regular menses (4 days; moderate flow; rare cramping). +vasectomy. Mammogram 08/2012 [facility]. Colonoscopy never. TDAP 06/14/2009. Pneumovax 07/10/2012. Flu vaccine 04/07/2013. Eye exam never.  +readers. Dental exam today.  [place].  L neck tenderness: radiates into ear.  Antibiotic prescribed by Dr. Cleta Alberts two months ago; pain completed resolved until last week five days ago.  Recurrent pain since last week after crying a lot and emotional period with turning in resignation at job.  No pain with swallowing.  No food getting stuck.  With burping or sneezing, pain recurs.  Blowing out with nose causes pain.  No mass in area.  No teeth pain or pain with chewing.  S/p ENT consultation due to L salivary gland prominence; this is a separate area.  S/p thyroid work up in past with Dr. Sherrie Mustache; s/p evaluation by Dr. Margaretmary Bayley; s/p radioactive work up;negative work up.  Has taken three Ibuprofen since onset five days ago.  Slowly improving on own.   Review of Systems  Constitutional: Negative.   HENT: Negative for congestion, dental problem, drooling, ear discharge, ear pain, facial swelling, hearing loss, mouth sores, nosebleeds, postnasal drip, rhinorrhea, sinus pressure, sneezing, sore throat, tinnitus, trouble swallowing and voice change.   Eyes: Negative for photophobia, pain, redness and visual disturbance.  Respiratory: Negative for cough, shortness of breath, wheezing and stridor.   Cardiovascular: Negative for chest pain, palpitations and leg swelling.  Gastrointestinal: Negative for nausea, vomiting, abdominal pain, diarrhea, constipation, blood in stool, abdominal distention, anal bleeding and rectal pain.  Endocrine: Negative.   Genitourinary: Negative  for dysuria, urgency, frequency, hematuria, flank pain, decreased urine volume, vaginal bleeding, vaginal discharge, enuresis, genital sores, vaginal pain, menstrual problem, pelvic pain and dyspareunia.  Musculoskeletal: Negative for arthralgias, back pain, gait problem, joint swelling, myalgias, neck pain and neck stiffness.  Skin: Negative for color change and rash.  Neurological: Negative for dizziness, tremors, seizures, syncope, facial asymmetry, speech difficulty, weakness, light-headedness, numbness and headaches.  Psychiatric/Behavioral: Negative for sleep disturbance and dysphoric mood. The patient is not nervous/anxious.    Past Medical History  Diagnosis Date  . Anxiety   . Arthritis     DDD lumbar  . Herpes labialis   . Hematuria 06/15/2007    s/p urology consultation [name]; s/p CT scan negative.  No cystoscopy.   Past Surgical History  Procedure Laterality Date  . Nose surgery    . Spine surgery  06/14/2008    Lumbar surgery L5-S1.  [place].   No Known Allergies Current Outpatient Prescriptions on File Prior to Visit  Medication Sig Dispense Refill  . ibuprofen (ADVIL,MOTRIN) 200 MG tablet Take 200 mg by mouth every 6 (six) hours as needed.      Marland Kitchen omeprazole (PRILOSEC) 20 MG capsule Take 1 capsule (20 mg total) by mouth daily.  30 capsule  3  . valACYclovir (VALTREX) 1000 MG tablet 2 PO Q 12 HOURS FOR 1 DAY; START WITH SYMPTOM ONSET  30 tablet  2   No current facility-administered medications on file prior to visit.   History   Social History  . Marital Status: Married    Spouse Name: [name]    Number of Children: 2  . Years of Education: 15   Occupational History  .  CMA    Social History Main Topics  . Smoking status: Current Every Day Smoker -- 1.50 packs/day for 25 years  . Smokeless tobacco: Never Used  . Alcohol Use: No     Comment: special occasions only.  . Drug Use: No  . Sexual Activity: Yes    Birth Control/ Protection: Surgical     Comment:  Husband with vasectomy.   Other Topics Concern  . Not on file   Social History Narrative   Marital status: married x 26 years; happily married; no abuse.      Children: two daughters; three grandchildren.        Employment: [employer] x 2005.  Starting with Dr. Royal Piedraennis Fera in [place]/Holistic physician; [employer] and [employer].        Tobacco:  1-1.25  ppd x 20 years.      Alcohol: two to three drinks per month      Drugs: none      Exercise: daily; videos; floor exercises; situps.      Seatbelt:  100%      Guns:  Loaded; secured.   Family History  Problem Relation Age of Onset  . Diabetes Mother   . Hypertension Mother   . Hyperlipidemia Mother   . Hyperlipidemia Father   . Cancer Father     prostate  . Stroke Father     after prostate cancer surgery with post-op pneumonia.  . Aortic stenosis Sister   . Hypertension Sister   . Hyperlipidemia Brother   . Hypertension Brother   . Diabetes Brother   . Hyperlipidemia Brother   . Arthritis Brother   . Hypertension Brother   . Mental illness Brother     anxiety  . Stroke Brother     multiple TIAs.       Objective:   Physical Exam  Nursing note and vitals reviewed. Constitutional: She is oriented to person, place, and time. She appears well-developed and well-nourished. No distress.  HENT:  Head: Normocephalic and atraumatic.  Left Ear: External ear normal.  Nose: Nose normal.  Mouth/Throat: Oropharynx is clear and moist.  Eyes: Conjunctivae and EOM are normal. Pupils are equal, round, and reactive to light.  Neck: Normal range of motion. Neck supple. Normal carotid pulses, no hepatojugular reflux and no JVD present. Carotid bruit is not present. No thyromegaly present.    +TTP along sternocleidomastoid ridge adjacent to carotid pulse on L.  No masses.    Cardiovascular: Normal rate, regular rhythm and normal heart sounds.  Exam reveals no gallop and no friction rub.   No murmur heard. Pulmonary/Chest: Effort  normal and breath sounds normal. She has no wheezes. She has no rales.  Abdominal: Soft. Bowel sounds are normal. She exhibits no distension and no mass. There is no tenderness. There is no rebound and no guarding.  Genitourinary: Rectum normal, vagina normal and uterus normal. No breast swelling, tenderness, discharge or bleeding. There is no rash, tenderness or lesion on the right labia. There is no rash, tenderness or lesion on the left labia. Cervix exhibits no motion tenderness, no discharge and no friability. Right adnexum displays no mass, no tenderness and no fullness. Left adnexum displays no mass, no tenderness and no fullness.  Musculoskeletal:       Right shoulder: Normal.       Left shoulder: Normal.       Cervical back: Normal.  Lymphadenopathy:    She has no cervical adenopathy.  Neurological: She is alert and oriented  to person, place, and time. She has normal reflexes. No cranial nerve deficit. She exhibits normal muscle tone. Coordination normal.  Skin: Skin is warm and dry. No rash noted. She is not diaphoretic. No erythema. No pallor.  Psychiatric: She has a normal mood and affect. Her behavior is normal. Judgment and thought content normal.      Results for orders placed in visit on 07/11/13  POCT UA - MICROSCOPIC ONLY      Result Value Range   WBC, Ur, HPF, POC 0-1     RBC, urine, microscopic 4-6     Bacteria, U Microscopic 1+     Mucus, UA pos     Epithelial cells, urine per micros 1-2     Crystals, Ur, HPF, POC neg     Casts, Ur, LPF, POC neg     Yeast, UA neg    POCT URINALYSIS DIPSTICK      Result Value Range   Color, UA yellow     Clarity, UA clear     Glucose, UA neg     Bilirubin, UA neg     Ketones, UA neg     Spec Grav, UA 1.015     Blood, UA small     pH, UA 7.0     Protein, UA neg     Urobilinogen, UA 1.0     Nitrite, UA neg     Leukocytes, UA Negative    IFOBT (OCCULT BLOOD)      Result Value Range   IFOBT Negative     EKG: NSR; no ST  changes    Assessment & Plan:  Routine general medical examination at a health care facility - Plan: POCT UA - Microscopic Only, POCT urinalysis dipstick, EKG 12-Lead, IFOBT POC (occult bld, rslt in office)  Routine gynecological examination  Tobacco abuse  1. CPE: anticipatory guidance --- weight loss, exercise, tobacco cessation.  Pap smear UTD: mammogram UTD.  Hemoccult negative. Immunizations UTD. Obtain labs. 2. Gynecological exam: completed; Pap smear and mammogram UTD.  Regular menses; vasectomy for contraception. 3. Tobacco abuse: precontemplative. 4.  L neck pain: New onset.  Benign exam today; if persistent in upcoming week, obtain carotid doppler.  Suggestive of muscular etiology; is slowly improving without intervention.  Nilda Simmer, M.D.  [facility] [address] [place], [place]  [address] [phone] phone [phone] fax

## 2013-07-16 ENCOUNTER — Encounter: Payer: BC Managed Care – PPO | Admitting: Family Medicine

## 2013-08-27 ENCOUNTER — Encounter: Payer: Self-pay | Admitting: Family Medicine

## 2013-09-05 ENCOUNTER — Ambulatory Visit: Payer: Self-pay | Admitting: Family Medicine

## 2013-10-12 ENCOUNTER — Encounter: Payer: Self-pay | Admitting: Family Medicine

## 2013-12-31 ENCOUNTER — Ambulatory Visit: Payer: Self-pay | Admitting: Emergency Medicine

## 2013-12-31 ENCOUNTER — Ambulatory Visit (INDEPENDENT_AMBULATORY_CARE_PROVIDER_SITE_OTHER): Payer: BC Managed Care – PPO | Admitting: Emergency Medicine

## 2013-12-31 ENCOUNTER — Other Ambulatory Visit: Payer: Self-pay | Admitting: *Deleted

## 2013-12-31 ENCOUNTER — Other Ambulatory Visit: Payer: BC Managed Care – PPO

## 2013-12-31 VITALS — BP 122/70 | HR 74 | Temp 98.6°F | Resp 16 | Ht 62.0 in | Wt 169.6 lb

## 2013-12-31 DIAGNOSIS — R1031 Right lower quadrant pain: Secondary | ICD-10-CM

## 2013-12-31 DIAGNOSIS — R109 Unspecified abdominal pain: Secondary | ICD-10-CM

## 2013-12-31 DIAGNOSIS — N83209 Unspecified ovarian cyst, unspecified side: Secondary | ICD-10-CM

## 2013-12-31 DIAGNOSIS — N951 Menopausal and female climacteric states: Secondary | ICD-10-CM

## 2013-12-31 LAB — POCT URINALYSIS DIPSTICK
Bilirubin, UA: NEGATIVE
Glucose, UA: NEGATIVE
Ketones, UA: NEGATIVE
LEUKOCYTES UA: NEGATIVE
Nitrite, UA: NEGATIVE
PH UA: 6.5
PROTEIN UA: NEGATIVE
Spec Grav, UA: 1.01
Urobilinogen, UA: 0.2

## 2013-12-31 LAB — CBC
HEMATOCRIT: 44.6 % (ref 36.0–46.0)
Hemoglobin: 15.4 g/dL — ABNORMAL HIGH (ref 12.0–15.0)
MCH: 32.4 pg (ref 26.0–34.0)
MCHC: 34.5 g/dL (ref 30.0–36.0)
MCV: 93.7 fL (ref 78.0–100.0)
Platelets: 323 10*3/uL (ref 150–400)
RBC: 4.76 MIL/uL (ref 3.87–5.11)
RDW: 13.3 % (ref 11.5–15.5)
WBC: 10 10*3/uL (ref 4.0–10.5)

## 2013-12-31 LAB — POCT URINE PREGNANCY: Preg Test, Ur: NEGATIVE

## 2013-12-31 MED ORDER — HYDROCODONE-ACETAMINOPHEN 5-325 MG PO TABS: 1.0000 | ORAL_TABLET | Freq: Four times a day (QID) | ORAL | Status: DC | PRN

## 2013-12-31 NOTE — Addendum Note (Signed)
Addended by: Lesle ChrisAUB, Jamille Yoshino A on: 12/31/2013 03:55 PM   Modules accepted: Orders

## 2013-12-31 NOTE — Progress Notes (Addendum)
Subjective:  This chart was scribed for Lesle Chris, MD by Carl Best, Medical Scribe. This patient was seen in Room 11 and the patient's care was started at 10:19 AM.   Patient ID: Nichole Klein, female    DOB: [birth year], 48 y.o.   MRN: [id number]  HPI HPI Comments: Nichole Klein is a 48 y.o. female who presents to the [facility] complaining of possible menopausal symptoms.  She states that her LNMP ended August 17, 2013.  She states that last year she would have very irregular menstrual cycles.  The patient lists hot flashes, sleep disturbances, diaphoresis, dizziness, nausea, and severe abdominal cramps as associated symptoms.  The patient denies dyspareunia as an associated symptom.  She states that she does not feel that her uterus is prolapsing down.  She states that she took two pregnancy tests this week and they were both negative. She states that her last PAP was January 2014.  She has a history of ovarian cysts and states that it did burst.  The patient's mother has a history of total hysterectomy.  The patient states that her husband has a history of total vesectomy.     Past Medical History  Diagnosis Date  . Anxiety   . Arthritis     DDD lumbar  . Herpes labialis   . Hematuria 06/15/2007    s/p urology consultation [name]; s/p CT scan negative.  No cystoscopy.   Past Surgical History  Procedure Laterality Date  . Nose surgery    . Spine surgery  06/14/2008    Lumbar surgery L5-S1.  [place].   Family History  Problem Relation Age of Onset  . Diabetes Mother   . Hypertension Mother   . Hyperlipidemia Mother   . Hyperlipidemia Father   . Cancer Father     prostate  . Stroke Father     after prostate cancer surgery with post-op pneumonia.  . Aortic stenosis Sister   . Hypertension Sister   . Hyperlipidemia Brother   . Hypertension Brother   . Diabetes Brother   . Hyperlipidemia Brother   . Arthritis Brother   . Hypertension Brother   .  Mental illness Brother     anxiety  . Stroke Brother     multiple TIAs.   History   Social History  . Marital Status: Married    Spouse Name: [name]    Number of Children: 2  . Years of Education: 15   Occupational History  . CMA    Social History Main Topics  . Smoking status: Current Every Day Smoker -- 1.50 packs/day for 25 years  . Smokeless tobacco: Never Used  . Alcohol Use: No     Comment: special occasions only.  . Drug Use: No  . Sexual Activity: Yes    Birth Control/ Protection: Surgical     Comment: Husband with vasectomy.   Other Topics Concern  . Not on file   Social History Narrative   Marital status: married x 26 years; happily married; no abuse.      Children: two daughters; three grandchildren.        Employment: [employer] x 2005.  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        Tobacco:  1-1.25  ppd x 20 years.      Alcohol: two to three drinks per month      Drugs: none      Exercise: daily; videos; floor  exercises; situps.      Seatbelt:  100%      Guns:  Loaded; secured.   No Known Allergies  Review of Systems  Constitutional: Positive for diaphoresis.  Gastrointestinal: Positive for nausea and abdominal pain.  Genitourinary: Negative for dyspareunia.  Neurological: Positive for dizziness.  Psychiatric/Behavioral: Positive for sleep disturbance.  All other systems reviewed and are negative.    Objective:  Physical Exam  Nursing note and vitals reviewed. Constitutional: She is oriented to person, place, and time. She appears well-developed and well-nourished.  HENT:  Head: Normocephalic and atraumatic.  Eyes: EOM are normal.  Neck: Normal range of motion.  Cardiovascular: Normal rate.   Pulmonary/Chest: Effort normal.  Musculoskeletal: Normal range of motion.  Neurological: She is alert and oriented to person, place, and time.  Skin: Skin is warm and dry.  Psychiatric: She has a  normal mood and affect. Her behavior is normal.   abdomen is flat there are bowel sounds present there is significant tenderness deep in the right lower quadrant. There is no rebound noted. Pelvic examination reveals significant tenderness without mass formation in the right adnexa. Results for orders placed in visit on 12/31/13  POCT URINE PREGNANCY      Result Value Ref Range   Preg Test, Ur Negative     Results for orders placed in visit on 12/31/13  CBC      Result Value Ref Range   WBC 10.0  4.0 - 10.5 K/uL   RBC 4.76  3.87 - 5.11 MIL/uL   Hemoglobin 15.4 (*) 12.0 - 15.0 g/dL   HCT 47.8  29.5 - 62.1 %   MCV 93.7  78.0 - 100.0 fL   MCH 32.4  26.0 - 34.0 pg   MCHC 34.5  30.0 - 36.0 g/dL   RDW 30.8  65.7 - 84.6 %   Platelets 323  150 - 400 K/uL  POCT URINE PREGNANCY      Result Value Ref Range   Preg Test, Ur Negative    POCT URINALYSIS DIPSTICK      Result Value Ref Range   Color, UA yellow     Clarity, UA clear     Glucose, UA neg     Bilirubin, UA neg     Ketones, UA neg     Spec Grav, UA 1.010     Blood, UA trace     pH, UA 6.5     Protein, UA neg     Urobilinogen, UA 0.2     Nitrite, UA neg     Leukocytes, UA Negative     Results for orders placed in visit on 12/31/13  FSH/LH      Result Value Ref Range   FSH 35.0     LH 43.9    HCG, QUANTITATIVE, PREGNANCY      Result Value Ref Range   hCG, Beta Chain, Quant, S <2.0    ESTRADIOL      Result Value Ref Range   Estradiol 108.8    CBC      Result Value Ref Range   WBC 10.0  4.0 - 10.5 K/uL   RBC 4.76  3.87 - 5.11 MIL/uL   Hemoglobin 15.4 (*) 12.0 - 15.0 g/dL   HCT 96.2  95.2 - 84.1 %   MCV 93.7  78.0 - 100.0 fL   MCH 32.4  26.0 - 34.0 pg   MCHC 34.5  30.0 - 36.0 g/dL   RDW 32.4  40.1 - 02.7 %  Platelets 323  150 - 400 K/uL  POCT URINE PREGNANCY      Result Value Ref Range   Preg Test, Ur Negative    POCT URINALYSIS DIPSTICK      Result Value Ref Range   Color, UA yellow     Clarity, UA clear      Glucose, UA neg     Bilirubin, UA neg     Ketones, UA neg     Spec Grav, UA 1.010     Blood, UA trace     pH, UA 6.5     Protein, UA neg     Urobilinogen, UA 0.2     Nitrite, UA neg     Leukocytes, UA Negative      BP 122/70  Pulse 74  Temp(Src) 98.6 F (37 C) (Oral)  Resp 16  Ht 5\' 2"  (1.575 m)  Wt 169 lb 9.6 oz (76.93 kg)  BMI 31.01 kg/m2  SpO2 99%  LMP 08/17/2013 Assessment & Plan:  The patient presents with right lower quadrant abdominal discomfort for the last week. She developed severe pain this morning. Most likely diagnosed as either be a ruptured ovarian cyst or torsion of an ovarian cyst. This could be related to a fibroid. I do feel she is going through menopause. We'll get an ultrasound of the pelvis this afternoon. At 7 PM I spoke with the patient and advised her of her normal ultrasound and CT reports. Her estradiol is in the normal range but FSH and LH levels would say she is going through menopause. I advised her to use her follow up with Dr. Katrinka BlazingSmith to discuss this with her to let me know and I would make her an appointment to [place].  I personally performed the services described in this documentation, which was scribed in my presence. The recorded information has been reviewed and is accurate.

## 2014-01-01 LAB — FSH/LH
FSH: 35 m[IU]/mL
LH: 43.9 m[IU]/mL

## 2014-01-01 LAB — ESTRADIOL: Estradiol: 108.8 pg/mL

## 2014-01-01 LAB — HCG, QUANTITATIVE, PREGNANCY

## 2014-01-04 ENCOUNTER — Telehealth: Payer: Self-pay

## 2014-01-15 ENCOUNTER — Encounter: Payer: Self-pay | Admitting: Emergency Medicine

## 2014-04-12 NOTE — Telephone Encounter (Signed)
A user error has taken place: encounter opened in error, closed for administrative reasons.

## 2014-06-28 ENCOUNTER — Encounter: Payer: Self-pay | Admitting: Family Medicine

## 2014-07-08 ENCOUNTER — Ambulatory Visit: Payer: Self-pay | Admitting: Family Medicine

## 2014-07-15 ENCOUNTER — Encounter: Payer: BC Managed Care – PPO | Admitting: Family Medicine

## 2014-08-09 ENCOUNTER — Ambulatory Visit (INDEPENDENT_AMBULATORY_CARE_PROVIDER_SITE_OTHER): Payer: 59 | Admitting: Emergency Medicine

## 2014-08-09 ENCOUNTER — Ambulatory Visit (INDEPENDENT_AMBULATORY_CARE_PROVIDER_SITE_OTHER): Payer: 59

## 2014-08-09 VITALS — BP 130/90 | HR 82 | Temp 98.3°F | Resp 12 | Ht 62.0 in | Wt 181.0 lb

## 2014-08-09 DIAGNOSIS — F17200 Nicotine dependence, unspecified, uncomplicated: Secondary | ICD-10-CM

## 2014-08-09 DIAGNOSIS — M62830 Muscle spasm of back: Secondary | ICD-10-CM

## 2014-08-09 DIAGNOSIS — M546 Pain in thoracic spine: Secondary | ICD-10-CM

## 2014-08-09 DIAGNOSIS — Z720 Tobacco use: Secondary | ICD-10-CM

## 2014-08-09 DIAGNOSIS — M4725 Other spondylosis with radiculopathy, thoracolumbar region: Secondary | ICD-10-CM

## 2014-08-09 MED ORDER — TRAMADOL HCL 50 MG PO TABS: 50.0000 mg | ORAL_TABLET | Freq: Three times a day (TID) | ORAL | Status: DC | PRN

## 2014-08-09 MED ORDER — NAPROXEN 500 MG PO TABS: 500.0000 mg | ORAL_TABLET | Freq: Two times a day (BID) | ORAL | Status: DC

## 2014-08-09 MED ORDER — CYCLOBENZAPRINE HCL 10 MG PO TABS: 10.0000 mg | ORAL_TABLET | Freq: Three times a day (TID) | ORAL | Status: DC | PRN

## 2014-08-09 NOTE — Progress Notes (Signed)
08/09/2014 at 7:58 PM  Nichole Klein / DOB: 1965/12/23 / MRN: [id number]  The patient has Tobacco abuse; Chest pain; Anxiety; Need for prophylactic vaccination against Streptococcus pneumoniae (pneumococcus); Routine gynecological examination; and Routine general medical examination at a health care facility on her problem list.  SUBJECTIVE  Chief compalaint: Pain and Muscle Pain   History of present illness: Nichole Klein is 49 y.o. well appearing female presenting for worsening severe right sided mid thoracic back pain that started 4 weeks ago. Associated symptoms include pain with movement and she denies paresthesia, change in bowl or bladder, saddle paresthesia, and upper or lower extremity weakness. She has tried l, Toradol, flexeril with poor relief. She reports relief with massage from her husband.  She is a smoker 30 pack year history.      She  has a past medical history of Anxiety; Arthritis; Herpes labialis; and Hematuria (06/15/2007).    She  has a current medication list which includes the following prescription(s): omeprazole, valacyclovir, cyclobenzaprine, ibuprofen, naproxen, and tramadol.  Ms. Curtner has No Known Allergies. She  reports that she has been smoking.  She has never used smokeless tobacco. She reports that she does not drink alcohol or use illicit drugs. She  reports that she currently engages in sexual activity. She reports using the following method of birth control/protection: Surgical. The patient  has past surgical history that includes Nose surgery and Spine surgery (06/14/2008).  Her family history includes Aortic stenosis in her sister; Arthritis in her brother; Cancer in her father; Diabetes in her brother and mother; Hyperlipidemia in her brother, brother, father, and mother; Hypertension in her brother, brother, mother, and sister; Mental illness in her brother; Stroke in her brother and father.  ROS  OBJECTIVE  Her  height is  (1.575 m) and  weight is 181 lb (82.101 kg). Her oral temperature is 98.3 F (36.8 C). Her blood pressure is 130/90 and her pulse is 82. Her respiration is 12 and oxygen saturation is 98%.  The patient's body mass index is 33.1 kg/(m^2).  Physical Exam  Musculoskeletal:       Right shoulder: Normal.       Left shoulder: Normal.       Right elbow: Normal.      Left elbow: Normal.       Back:       Right hand: Normal sensation noted. Normal strength noted.       Left hand: Normal sensation noted. Normal strength noted.  Neurological: She has normal strength. No cranial nerve deficit or sensory deficit.  Reflex Scores:      Tricep reflexes are 2+ on the right side and 2+ on the left side.      Bicep reflexes are 2+ on the right side and 2+ on the left side.      Brachioradialis reflexes are 2+ on the right side and 2+ on the left side.  [facility] reading (PRIMARY) by  Dr. Dareen Piano: Mild arthritic changes noted.  No fracture.    No results found for this or any previous visit (from the past 24 hour(s)).  ASSESSMENT & PLAN  Nichole Klein was seen today for pain and muscle pain.  Diagnoses and all orders for this visit:  Midline thoracic back pain Orders: -     DG Thoracic Spine 2 View; Future -     traMADol (ULTRAM) 50 MG tablet; Take 1 tablet (50 mg total) by mouth every 8 (eight) hours as needed. -  naproxen (NAPROSYN) 500 MG tablet; Take 1 tablet (500 mg total) by mouth 2 (two) times daily with a meal. -     Ambulatory referral to Physical Therapy  Muscle spasm of back Orders: -     cyclobenzaprine (FLEXERIL) 10 MG tablet; Take 1 tablet (10 mg total) by mouth 3 (three) times daily as needed for muscle spasms. -     Inject trigger point, 1 or 2  Osteoarthritis of spine with radiculopathy, thoracolumbar region Orders: -     Ambulatory referral to Physical Therapy  Smoker Orders: -     PR DEMO-SMOKING CESSATION COUN    The patient was advised to call or come back to clinic if she does not see  an improvement in symptoms, or worsens with the above plan.   Nichole Klein, MHS, PA-C [facility] 08/09/2014 7:58 PM

## 2014-08-10 ENCOUNTER — Telehealth: Payer: Self-pay

## 2014-08-10 NOTE — Telephone Encounter (Signed)
Pt states that she was seen yesterday by Dolores LoryMichael Lee Clark, PA-C at 08/09/2014 6:38 PM  For Midline thoracic back pain  and Muscle spasm of back. Pt states that her treatment is not helping at all, and she is in need of some pain medication. Please advise.

## 2014-08-12 MED ORDER — HYDROCODONE-ACETAMINOPHEN 5-325 MG PO TABS: 1.0000 | ORAL_TABLET | Freq: Four times a day (QID) | ORAL | Status: DC | PRN

## 2014-08-12 NOTE — Telephone Encounter (Signed)
Please call patient back with the following: I will write for a short course of opioids.  Please advise the patient that I will not refill, as I am not trained nor qualified to manage long term opioid use.   I am happy to assist her in a pain clinic referral if she would like for further management of this problem. I hope that she will pursue physical therapy. Please advise that she avoid tylenol containing products and tramadol while taking narcotics.  I will route to [employer] CompanyChelle Jeffery PA-C to fill a seven day course of appropriate opioid, as I am out of the office.  Deliah BostonMichael Veyda Kaufman, MS, PA-C   2:37 PM, 08/12/2014

## 2014-08-12 NOTE — Progress Notes (Signed)
  Medical screening examination/treatment/procedure(s) were performed by non-physician practitioner and as supervising physician I was immediately available for consultation/collaboration.     

## 2014-08-12 NOTE — Addendum Note (Signed)
Addended by: Fernande BrasJEFFERY, Sabel Hornbeck S on: 08/12/2014 02:49 PM   Modules accepted: Orders

## 2014-08-12 NOTE — Telephone Encounter (Signed)
Meds ordered this encounter  Medications  . HYDROcodone-acetaminophen (NORCO) 5-325 MG per tablet    Sig: Take 1 tablet by mouth every 6 (six) hours as needed.    Dispense:  30 tablet    Refill:  0    Order Specific Question:  Supervising Provider    Answer:  DOOLITTLE, ROBERT P [[id number]]

## 2014-08-13 NOTE — Telephone Encounter (Signed)
RX in draw at 102.

## 2014-08-13 NOTE — Telephone Encounter (Signed)
Left message for pt to pick up Rx at 102

## 2014-11-18 ENCOUNTER — Ambulatory Visit (INDEPENDENT_AMBULATORY_CARE_PROVIDER_SITE_OTHER): Payer: 59 | Admitting: Emergency Medicine

## 2014-11-18 VITALS — BP 126/70 | HR 93 | Temp 98.2°F | Resp 17 | Ht 61.0 in | Wt 173.4 lb

## 2014-11-18 DIAGNOSIS — Z780 Asymptomatic menopausal state: Secondary | ICD-10-CM

## 2014-11-18 DIAGNOSIS — F411 Generalized anxiety disorder: Secondary | ICD-10-CM | POA: Diagnosis not present

## 2014-11-18 MED ORDER — LORAZEPAM 0.5 MG PO TABS: ORAL_TABLET | ORAL | Status: DC

## 2014-11-18 NOTE — Patient Instructions (Signed)
Black Cohosh, Cimicifuga racemosa oral dosage forms What is this medicine? BLACK COHOSH (blak KOH hosh) or Cimicifuga racemosa is a dietary supplement. It is being promoted to help support female health problems, like the symptoms of menopause (hot flashes). Black cohosh is also promoted to ease menstrual pain or pre-menstrual syndrome (PMS). The FDA does not recognize black cohosh as being safe or effective for any use at this time, and warns against its use in pregnancy. This supplement may be used for other purposes; ask your health care provider or pharmacist if you have questions. This medicine may be used for other purposes; ask your health care provider or pharmacist if you have questions. What should I tell my health care provider before I take this medicine? They need to know if you have any of these conditions: -cancer -endometriosis or uterine fibroids -high blood pressure -infertility -kidney disease -liver disease -menstrual changes or irregular periods -unusual vaginal or uterine bleeding -an unusual or allergic reaction to black cohosh, soybeans, tartrazine dye (yellow dye number 5), other medicines, foods, dyes, or preservatives -pregnant or trying to get pregnant -breast-feeding How should I use this medicine? Take this herb by mouth with a glass of water. Follow the directions on the package labeling, or talk to your health care professional. Do not use for longer than 6 months without the advice of a health care professional. Do not use if you are pregnant or breast-feeding. Talk to your obstetrician-gynecologist or certified nurse-midwife. This herb is not for use in children under the age of 18 years. Overdosage: If you think you have taken too much of this medicine contact a poison control center or emergency room at once. NOTE: This medicine is only for you. Do not share this medicine with others. What if I miss a dose? If you miss a dose, take it as soon as you can. If  it is almost time for your next dose, take only that dose. Do not take double or extra doses. What may interact with this medicine? -female hormones, like estrogens or progestins and birth control pills -fertility treatments -medicines for blood pressure -medicines for diabetes This list may not describe all possible interactions. Give your health care provider a list of all the medicines, herbs, non-prescription drugs, or dietary supplements you use. Also tell them if you smoke, drink alcohol, or use illegal drugs. Some items may interact with your medicine. What should I watch for while using this medicine? Since this herb is derived from a plant, allergic reactions are possible. Stop using this herb if you develop a rash. You may need to see your health care professional, or inform them that this occurred. Report any unusual side effects promptly. If you are taking this herb for menstrual or menopausal symptoms, visit your doctor or health care professional for regular checks on your progress. You should have a complete check-up every 6 months. You will need a regular breast and pelvic exam while on this therapy. Follow the advice of your doctor or health care professional. If you have any reason to think you are pregnant, stop taking this herb at once and contact your doctor or health care professional. Herbal or dietary supplements are not regulated like medicines. Rigid quality control standards are not required for dietary supplements. The purity and strength of these products can vary. The safety and effect of this dietary supplement for a certain disease or illness is not well known. This product is not intended to diagnose, treat, cure or   prevent any disease. The Food and Drug Administration suggests the following to help consumers protect themselves: -Always read product labels and follow directions. -Natural does not mean a product is safe for humans to take. -Look for products that  include USP after the ingredient name. This means that the manufacturer followed the standards of the [place] Pharmacopoeia. -Supplements made or sold by a nationally known food or drug company are more likely to be made under tight controls. You can write to the company for more information about how the product was made. What side effects may I notice from receiving this medicine? Side effects that you should report to your doctor or health care professional as soon as possible: -allergic reactions like skin rash, itching or hives, swelling of the face, lips, or tongue -difficulty breathing, shortness of breath, or wheezing -easy bruising -fast heartbeat, slow heartbeat, or palpitations -headache -high blood pressure -severe nausea or vomiting -unusually weak or tired Side effects that usually do not require medical attention (report to your doctor or health care professional if they continue or are bothersome): -heartburn -mild upset stomach This list may not describe all possible side effects. Call your doctor for medical advice about side effects. You may report side effects to FDA at 1-800-FDA-1088. Where should I keep my medicine? Keep out of the reach of children. Store at room temperature between 15 and 30 degrees C (59 and 86 degrees C). Throw away any unused herb after the expiration date. NOTE: This sheet is a summary. It may not cover all possible information. If you have questions about this medicine, talk to your doctor, pharmacist, or health care provider.  2015, Elsevier/Gold Standard. (2007-08-31 13:29:09) Menopause Menopause is the normal time of life when menstrual periods stop completely. Menopause is complete when you have missed 12 consecutive menstrual periods. It usually occurs between the ages of 48 years and 55 years. Very rarely does a woman develop menopause before the age of 40 years. At menopause, your ovaries stop producing the female hormones estrogen and  progesterone. This can cause undesirable symptoms and also affect your health. Sometimes the symptoms may occur 4-5 years before the menopause begins. There is no relationship between menopause and:  Oral contraceptives.  Number of children you had.  Race.  The age your menstrual periods started (menarche). Heavy smokers and very thin women may develop menopause earlier in life. CAUSES  The ovaries stop producing the female hormones estrogen and progesterone.  Other causes include:  Surgery to remove both ovaries.  The ovaries stop functioning for no known reason.  Tumors of the pituitary gland in the brain.  Medical disease that affects the ovaries and hormone production.  Radiation treatment to the abdomen or pelvis.  Chemotherapy that affects the ovaries. SYMPTOMS   Hot flashes.  Night sweats.  Decrease in sex drive.  Vaginal dryness and thinning of the vagina causing painful intercourse.  Dryness of the skin and developing wrinkles.  Headaches.  Tiredness.  Irritability.  Memory problems.  Weight gain.  Bladder infections.  Hair growth of the face and chest.  Infertility. More serious symptoms include:  Loss of bone (osteoporosis) causing breaks (fractures).  Depression.  Hardening and narrowing of the arteries (atherosclerosis) causing heart attacks and strokes. DIAGNOSIS   When the menstrual periods have stopped for 12 straight months.  Physical exam.  Hormone studies of the blood. TREATMENT  There are many treatment choices and nearly as many questions about them. The decisions to treat or not  to treat menopausal changes is an individual choice made with your health care provider. Your health care provider can discuss the treatments with you. Together, you can decide which treatment will work best for you. Your treatment choices may include:   Hormone therapy (estrogen and progesterone).  Non-hormonal medicines.  Treating the  individual symptoms with medicine (for example antidepressants for depression).  Herbal medicines that may help specific symptoms.  Counseling by a psychiatrist or psychologist.  Group therapy.  Lifestyle changes including:  Eating healthy.  Regular exercise.  Limiting caffeine and alcohol.  Stress management and meditation.  No treatment. HOME CARE INSTRUCTIONS   Take the medicine your health care provider gives you as directed.  Get plenty of sleep and rest.  Exercise regularly.  Eat a diet that contains calcium (good for the bones) and soy products (acts like estrogen hormone).  Avoid alcoholic beverages.  Do not smoke.  If you have hot flashes, dress in layers.  Take supplements, calcium, and vitamin D to strengthen bones.  You can use over-the-counter lubricants or moisturizers for vaginal dryness.  Group therapy is sometimes very helpful.  Acupuncture may be helpful in some cases. SEEK MEDICAL CARE IF:   You are not sure you are in menopause.  You are having menopausal symptoms and need advice and treatment.  You are still having menstrual periods after age 96 years.  You have pain with intercourse.  Menopause is complete (no menstrual period for 12 months) and you develop vaginal bleeding.  You need a referral to a specialist (gynecologist, psychiatrist, or psychologist) for treatment. SEEK IMMEDIATE MEDICAL CARE IF:   You have severe depression.  You have excessive vaginal bleeding.  You fell and think you have a broken bone.  You have pain when you urinate.  You develop leg or chest pain.  You have a fast pounding heart beat (palpitations).  You have severe headaches.  You develop vision problems.  You feel a lump in your breast.  You have abdominal pain or severe indigestion. Document Released: 08/21/2003 Document Revised: 01/31/2013 Document Reviewed: 12/28/2012 [facility] Patient Information 2015 [place], [place]. This information  is not intended to replace advice given to you by your health care provider. Make sure you discuss any questions you have with your health care provider.

## 2014-11-18 NOTE — Progress Notes (Signed)
Subjective:   This chart was scribed for Earl LitesSteve Ileta Ofarrell, MD by Andrew Auaven Small, ED Scribe. This patient was seen in room 3 and the patient's care was started at 10:36 AM.   Patient ID: Nichole Klein, female    DOB: Nov 03, 1965, 49 y.o.   MRN: [id number]  HPI   Chief Complaint  Patient presents with  . Menopause   HPI Comments: Nichole Klein is a 49 y.o. female who presents to the [facility] complaining of menopause symptoms. She reports worsening symptoms of hot flashes, the inability to sleep, waking up 3-4 times a night, agitation towards family members and husband and feeling emotional. Pt reports 3 menses and minimal spotting in the last year and a half. States last gyn exam was with Dr. Katrinka BlazingSmith and plans to schedule an appointment with her for a physical. Pt is a smoker and states she has increased her smoking to about 25 cigarettes a day.  Past Medical History  Diagnosis Date  . Anxiety   . Arthritis     DDD lumbar  . Herpes labialis   . Hematuria 06/15/2007    s/p urology consultation Assunta GamblesBrian Cope; s/p CT scan negative.  No cystoscopy.   No Known Allergies Prior to Admission medications   Medication Sig Start Date End Date Taking? Authorizing Provider  ibuprofen (ADVIL,MOTRIN) 200 MG tablet Take 200 mg by mouth every 6 (six) hours as needed.   Yes Historical Provider, MD  omeprazole (PRILOSEC) 20 MG capsule Take 1 capsule (20 mg total) by mouth daily. 02/22/13  Yes Ethelda ChickKristi M Smith, MD  valACYclovir (VALTREX) 1000 MG tablet 2 PO Q 12 HOURS FOR 1 DAY; START WITH SYMPTOM ONSET 07/02/12  Yes Ryan M Dunn, PA-C  cyclobenzaprine (FLEXERIL) 10 MG tablet Take 1 tablet (10 mg total) by mouth 3 (three) times daily as needed for muscle spasms. Patient not taking: Reported on 11/18/2014 08/09/14   Ofilia NeasMichael L Clark, PA-C  HYDROcodone-acetaminophen (NORCO) 5-325 MG per tablet Take 1 tablet by mouth every 6 (six) hours as needed. Patient not taking: Reported on 11/18/2014 08/12/14    Porfirio Oarhelle Jeffery, PA-C  naproxen (NAPROSYN) 500 MG tablet Take 1 tablet (500 mg total) by mouth 2 (two) times daily with a meal. Patient not taking: Reported on 11/18/2014 08/09/14   Ofilia NeasMichael L Clark, PA-C  traMADol (ULTRAM) 50 MG tablet Take 1 tablet (50 mg total) by mouth every 8 (eight) hours as needed. Patient not taking: Reported on 11/18/2014 08/09/14   Ofilia NeasMichael L Clark, PA-C   Review of Systems  Genitourinary: Positive for menstrual problem ( irregular).  Psychiatric/Behavioral: Positive for sleep disturbance and agitation.    Objective:   Physical Exam  CONSTITUTIONAL: Well developed/well nourished HEAD: Normocephalic/atraumatic EYES: EOMI/PERRL ENMT: Mucous membranes moist NECK: supple no meningeal signs SPINE/BACK:entire spine nontender CV: S1/S2 noted, no murmurs/rubs/gallops noted LUNGS: Lungs are clear to auscultation bilaterally, no apparent distress ABDOMEN: soft, nontender, no rebound or guarding, bowel sounds noted throughout abdomen GU:no cva tenderness NEURO: Pt is awake/alert/appropriate, moves all extremitiesx4.  No facial droop.   EXTREMITIES: pulses normal/equal, full ROM SKIN: warm, color normal PSYCH: no abnormalities of mood noted, alert and oriented to situation  Filed Vitals:   11/18/14 0940  BP: 126/70  Pulse: 93  Temp: 98.2 F (36.8 C)  TempSrc: Oral  Resp: 17  Height: 5\' 1"  (1.549 m)  Weight: 173 lb 6.4 oz (78.654 kg)  SpO2: 96%   Assessment & Plan:   I discussed the  possibility of taking Effexor. She does not want to do this. I told her to do some research about the drug black cohosh. She is going to do this. We'll try her on a low-dose Ativan to take when she feels extremely anxious. She is going to discuss this with Dr. Katrinka Blazing in a few weeks. She was previously checked with blood work to include an [facility] which was elevated at 35 and LH of 43 both consistent with menopause.I personally performed the services described in this documentation, which was  scribed in my presence. The recorded information has been reviewed and is accurate.  Earl Lites, MD

## 2014-11-30 ENCOUNTER — Ambulatory Visit (INDEPENDENT_AMBULATORY_CARE_PROVIDER_SITE_OTHER): Payer: 59 | Admitting: Family Medicine

## 2014-11-30 VITALS — BP 138/84 | HR 73 | Temp 97.7°F | Resp 18 | Ht 61.5 in | Wt 174.6 lb

## 2014-11-30 DIAGNOSIS — Z720 Tobacco use: Secondary | ICD-10-CM | POA: Diagnosis not present

## 2014-11-30 DIAGNOSIS — F411 Generalized anxiety disorder: Secondary | ICD-10-CM | POA: Diagnosis not present

## 2014-11-30 DIAGNOSIS — Z13220 Encounter for screening for lipoid disorders: Secondary | ICD-10-CM

## 2014-11-30 DIAGNOSIS — Z01419 Encounter for gynecological examination (general) (routine) without abnormal findings: Secondary | ICD-10-CM

## 2014-11-30 DIAGNOSIS — Z6832 Body mass index (BMI) 32.0-32.9, adult: Secondary | ICD-10-CM | POA: Diagnosis not present

## 2014-11-30 DIAGNOSIS — Z131 Encounter for screening for diabetes mellitus: Secondary | ICD-10-CM | POA: Diagnosis not present

## 2014-11-30 DIAGNOSIS — N959 Unspecified menopausal and perimenopausal disorder: Secondary | ICD-10-CM | POA: Diagnosis not present

## 2014-11-30 DIAGNOSIS — E669 Obesity, unspecified: Secondary | ICD-10-CM

## 2014-11-30 DIAGNOSIS — Z0000 Encounter for general adult medical examination without abnormal findings: Secondary | ICD-10-CM | POA: Diagnosis not present

## 2014-11-30 LAB — POCT UA - MICROSCOPIC ONLY
CASTS, UR, LPF, POC: NEGATIVE
Crystals, Ur, HPF, POC: NEGATIVE
Mucus, UA: NEGATIVE
YEAST UA: NEGATIVE

## 2014-11-30 LAB — POCT URINALYSIS DIPSTICK
BILIRUBIN UA: NEGATIVE
Glucose, UA: NEGATIVE
Ketones, UA: NEGATIVE
Leukocytes, UA: NEGATIVE
Nitrite, UA: NEGATIVE
PROTEIN UA: NEGATIVE
SPEC GRAV UA: 1.02
Urobilinogen, UA: 0.2
pH, UA: 6.5

## 2014-11-30 LAB — COMPREHENSIVE METABOLIC PANEL
ALBUMIN: 4 g/dL (ref 3.5–5.2)
ALK PHOS: 86 U/L (ref 39–117)
ALT: 11 U/L (ref 0–35)
AST: 14 U/L (ref 0–37)
BUN: 11 mg/dL (ref 6–23)
CHLORIDE: 103 meq/L (ref 96–112)
CO2: 29 mEq/L (ref 19–32)
Calcium: 9.4 mg/dL (ref 8.4–10.5)
Creat: 0.65 mg/dL (ref 0.50–1.10)
Glucose, Bld: 83 mg/dL (ref 70–99)
POTASSIUM: 4.5 meq/L (ref 3.5–5.3)
Sodium: 140 mEq/L (ref 135–145)
TOTAL PROTEIN: 6.8 g/dL (ref 6.0–8.3)
Total Bilirubin: 0.5 mg/dL (ref 0.2–1.2)

## 2014-11-30 LAB — CBC WITH DIFFERENTIAL/PLATELET
Basophils Absolute: 0 10*3/uL (ref 0.0–0.1)
Basophils Relative: 0 % (ref 0–1)
EOS ABS: 0.1 10*3/uL (ref 0.0–0.7)
EOS PCT: 1 % (ref 0–5)
HCT: 46.6 % — ABNORMAL HIGH (ref 36.0–46.0)
Hemoglobin: 15.5 g/dL — ABNORMAL HIGH (ref 12.0–15.0)
LYMPHS ABS: 2 10*3/uL (ref 0.7–4.0)
LYMPHS PCT: 23 % (ref 12–46)
MCH: 32.5 pg (ref 26.0–34.0)
MCHC: 33.3 g/dL (ref 30.0–36.0)
MCV: 97.7 fL (ref 78.0–100.0)
MPV: 11.3 fL (ref 8.6–12.4)
Monocytes Absolute: 0.5 10*3/uL (ref 0.1–1.0)
Monocytes Relative: 6 % (ref 3–12)
NEUTROS PCT: 70 % (ref 43–77)
Neutro Abs: 6 10*3/uL (ref 1.7–7.7)
Platelets: 310 10*3/uL (ref 150–400)
RBC: 4.77 MIL/uL (ref 3.87–5.11)
RDW: 12.7 % (ref 11.5–15.5)
WBC: 8.6 10*3/uL (ref 4.0–10.5)

## 2014-11-30 LAB — TSH: TSH: 1.952 u[IU]/mL (ref 0.350–4.500)

## 2014-11-30 LAB — LIPID PANEL
Cholesterol: 201 mg/dL — ABNORMAL HIGH (ref 0–200)
HDL: 64 mg/dL (ref >=46)
LDL CALC: 123 mg/dL — AB (ref 0–99)
Total CHOL/HDL Ratio: 3.1 Ratio
Triglycerides: 70 mg/dL (ref <150)
VLDL: 14 mg/dL (ref 0–40)

## 2014-11-30 LAB — HEMOGLOBIN A1C
HEMOGLOBIN A1C: 5.6 % (ref <5.7)
Mean Plasma Glucose: 114 mg/dL (ref <117)

## 2014-11-30 LAB — VITAMIN B12: Vitamin B-12: 368 pg/mL (ref 211–911)

## 2014-11-30 MED ORDER — VENLAFAXINE HCL 37.5 MG PO TABS: 37.5000 mg | ORAL_TABLET | Freq: Two times a day (BID) | ORAL | Status: DC

## 2014-11-30 MED ORDER — LORAZEPAM 0.5 MG PO TABS: ORAL_TABLET | ORAL | Status: DC

## 2014-11-30 NOTE — Progress Notes (Signed)
Subjective:    Patient ID: Nichole Klein, female    DOB: 1966/05/31, 49 y.o.   MRN: [id number]  11/30/2014  Annual Exam   HPI This 49 y.o. female presents for Complete Physical Examination.  Last physical:  07-11-2013 Pap smear:  2014 Mammogram: 08/2013 [facility] Colonoscopy:  never TDAP:  2011 Pneumovax:  2014 Zostavax: never Influenza:  2015 Eye exam:  Never; +readers; no family history of glaucoma Dental exam:  07/2014   Anxiety:  Back at [facility] with [facility].  Irritable as can be.  Saw Dr. Cleta Alberts two weeks ago; discussed HRT with Dr. Cleta Alberts; cannot have HRT due to tobacco abuse.  Cussed out guy at gas station.  Hot flashes.  Cussed Sammy out four times that weekend.  Let and went to mother's house.  Has taken Ativan but not very effective; will take at night; will help with sleep; was waking up 3-4 times per night.  No excessive sweating; will break out on forehead.  Has a fan on nightstand which is very helpful.  Taking one whole Ativan at bedtime.  Has taken during the day once; has still felt very irritable with Ativan.  Has taken twice during the day.  There is a new girl at work who is difficult to deal with.  Has not been confrontational with coworker.  Discussed black kohosh with pharmacist at [facility]; did not recommend black kohosh.  Did not start black kohosh; last summer hot flashes were horrible; they have improved.  Able to tolerate hot flashes.  Discussed Effexor with Dr. Cleta Alberts; did not want to take Effexor.  Willing to try Effexor.    Mole L groin that is new.  Review of Systems  Constitutional: Negative for fever, chills, diaphoresis, activity change, appetite change, fatigue and unexpected weight change.  HENT: Negative for congestion, dental problem, drooling, ear discharge, ear pain, facial swelling, hearing loss, mouth sores, nosebleeds, postnasal drip, rhinorrhea, sinus pressure, sneezing, sore throat, tinnitus, trouble swallowing and voice change.     Eyes: Negative for photophobia, pain, discharge, redness, itching and visual disturbance.  Respiratory: Positive for cough. Negative for apnea, choking, chest tightness, shortness of breath, wheezing and stridor.   Cardiovascular: Negative for chest pain, palpitations and leg swelling.  Gastrointestinal: Positive for constipation. Negative for nausea, vomiting, abdominal pain, diarrhea, blood in stool, abdominal distention, anal bleeding and rectal pain.  Endocrine: Negative for cold intolerance, heat intolerance, polydipsia, polyphagia and polyuria.  Genitourinary: Negative for dysuria, urgency, frequency, hematuria, flank pain, decreased urine volume, vaginal bleeding, vaginal discharge, enuresis, difficulty urinating, genital sores, vaginal pain, menstrual problem, pelvic pain and dyspareunia.  Musculoskeletal: Negative for myalgias, back pain, joint swelling, arthralgias, gait problem, neck pain and neck stiffness.  Skin: Positive for color change. Negative for pallor, rash and wound.  Allergic/Immunologic: Negative for environmental allergies, food allergies and immunocompromised state.  Neurological: Negative for dizziness, tremors, seizures, syncope, facial asymmetry, speech difficulty, weakness, light-headedness, numbness and headaches.  Hematological: Negative for adenopathy. Does not bruise/bleed easily.  Psychiatric/Behavioral: Positive for sleep disturbance. Negative for suicidal ideas, hallucinations, behavioral problems, confusion, self-injury, dysphoric mood, decreased concentration and agitation. The patient is nervous/anxious. The patient is not hyperactive.     Past Medical History  Diagnosis Date  . Anxiety   . Arthritis     DDD lumbar  . Herpes labialis   . Hematuria 06/15/2007    s/p urology consultation [name]; s/p CT scan negative.  No cystoscopy.   Past Surgical History  Procedure Laterality Date  . Nose surgery    . Spine surgery  06/14/2008    Lumbar surgery  L5-S1.  [place].   No Known Allergies Current Outpatient Prescriptions  Medication Sig Dispense Refill  . ibuprofen (ADVIL,MOTRIN) 200 MG tablet Take 200 mg by mouth every 6 (six) hours as needed.    Marland Kitchen LORazepam (ATIVAN) 0.5 MG tablet Take 1/2-1 tablet every 6-8 hours as needed for stress 45 tablet 1  . ranitidine (ZANTAC) 150 MG tablet Take 150 mg by mouth every morning.    . valACYclovir (VALTREX) 1000 MG tablet 2 PO Q 12 HOURS FOR 1 DAY; START WITH SYMPTOM ONSET 30 tablet 2  . cyclobenzaprine (FLEXERIL) 10 MG tablet Take 1 tablet (10 mg total) by mouth 3 (three) times daily as needed for muscle spasms. (Patient not taking: Reported on 11/18/2014) 30 tablet 0  . HYDROcodone-acetaminophen (NORCO) 5-325 MG per tablet Take 1 tablet by mouth every 6 (six) hours as needed. (Patient not taking: Reported on 11/18/2014) 30 tablet 0  . naproxen (NAPROSYN) 500 MG tablet Take 1 tablet (500 mg total) by mouth 2 (two) times daily with a meal. (Patient not taking: Reported on 11/18/2014) 30 tablet 0  . omeprazole (PRILOSEC) 20 MG capsule Take 1 capsule (20 mg total) by mouth daily. (Patient not taking: Reported on 11/30/2014) 30 capsule 3  . traMADol (ULTRAM) 50 MG tablet Take 1 tablet (50 mg total) by mouth every 8 (eight) hours as needed. (Patient not taking: Reported on 11/18/2014) 30 tablet 0  . venlafaxine (EFFEXOR) 37.5 MG tablet Take 1 tablet (37.5 mg total) by mouth 2 (two) times daily. 30 tablet 2   No current facility-administered medications for this visit.       Objective:    BP 138/84 mmHg  Pulse 73  Temp(Src) 97.7 F (36.5 C)  Resp 18  Ht 5' 1.5" (1.562 m)  Wt 174 lb 9.6 oz (79.198 kg)  BMI 32.46 kg/m2  SpO2 98% Physical Exam  Constitutional: She is oriented to person, place, and time. She appears well-developed and well-nourished. No distress.  HENT:  Head: Normocephalic and atraumatic.  Right Ear: External ear normal.  Left Ear: External ear normal.  Nose: Nose normal.  Mouth/Throat:  Oropharynx is clear and moist.  Eyes: Conjunctivae and EOM are normal. Pupils are equal, round, and reactive to light.  Neck: Normal range of motion and full passive range of motion without pain. Neck supple. No JVD present. Carotid bruit is not present. No thyromegaly present.  Cardiovascular: Normal rate, regular rhythm and normal heart sounds.  Exam reveals no gallop and no friction rub.   No murmur heard. Pulmonary/Chest: Effort normal and breath sounds normal. She has no wheezes. She has no rales. Right breast exhibits no inverted nipple, no mass, no nipple discharge, no skin change and no tenderness. Left breast exhibits no inverted nipple, no mass, no nipple discharge, no skin change and no tenderness. Breasts are symmetrical.  Abdominal: Soft. Bowel sounds are normal. She exhibits no distension and no mass. There is no tenderness. There is no rebound and no guarding.  Genitourinary: Vagina normal and uterus normal. There is no rash, tenderness or injury on the right labia. There is no rash, tenderness, lesion or injury on the left labia. Cervix exhibits no motion tenderness, no discharge and no friability. Right adnexum displays no mass, no tenderness and no fullness. Left adnexum displays no mass, no tenderness and no fullness.  Musculoskeletal:  Right shoulder: Normal.       Left shoulder: Normal.       Cervical back: Normal.  Lymphadenopathy:    She has no cervical adenopathy.  Neurological: She is alert and oriented to person, place, and time. She has normal reflexes. No cranial nerve deficit. She exhibits normal muscle tone. Coordination normal.  Skin: Skin is warm and dry. No rash noted. She is not diaphoretic. No erythema. No pallor.  L groin nevus 65mmx4mm.  Psychiatric: She has a normal mood and affect. Her behavior is normal. Judgment and thought content normal.  Nursing note and vitals reviewed.  Results for orders placed or performed in visit on 11/30/14  POCT urinalysis  dipstick  Result Value Ref Range   Color, UA yellow    Clarity, UA clear    Glucose, UA neg    Bilirubin, UA neg    Ketones, UA neg    Spec Grav, UA 1.020    Blood, UA small    pH, UA 6.5    Protein, UA neg    Urobilinogen, UA 0.2    Nitrite, UA neg    Leukocytes, UA Negative Negative  POCT UA - Microscopic Only  Result Value Ref Range   WBC, Ur, HPF, POC 0-1    RBC, urine, microscopic 0-1    Bacteria, U Microscopic trace    Mucus, UA neg    Epithelial cells, urine per micros 0-1    Crystals, Ur, HPF, POC neg    Casts, Ur, LPF, POC neg    Yeast, UA neg        Assessment & Plan:   1. Routine physical examination   2. Encounter for routine gynecological examination   3. Screening for diabetes mellitus   4. Screening, lipid   5. Anxiety state   6. Menopausal disorder     1. Complete Physical Examination: anticipatory guidance -- smoking cessation, weight loss, exercise.  Pap smear obtained; refer for mammogram.  Immunizations UTD.   2.  Gynecological exam: perimenopausal; LMP 8 months ago; pap smear obtained; refer for mammogram.  HRT contraindicated due to tobacco abuse. 3.  Screening DMII: obtain glucose, HgbA1c. 4. Screening lipid: obtain FLP. 5.  Anxiety state: New. Associated with perimenopausal state; rx for Effexor 37.5mg  daily provided;refill of Ativan provided as well.  RTC 2 months for recheck. 6.  Menopausal disorder: New.  LMP 8 months ago; suffering with hot flashes mild and emotional lability.  Rx for Effexor XR 37.5mg  daily and Ativan. 7. Tobacco abuse: cessation encouraged; pt plans to quit 03/2015. 8. Obesity/BMI 32: encourage weight loss, exercise.  Previous [employer] with success.    Meds ordered this encounter  Medications  . ranitidine (ZANTAC) 150 MG tablet    Sig: Take 150 mg by mouth every morning.  . venlafaxine (EFFEXOR) 37.5 MG tablet    Sig: Take 1 tablet (37.5 mg total) by mouth 2 (two) times daily.    Dispense:  30 tablet    Refill:  2  .  LORazepam (ATIVAN) 0.5 MG tablet    Sig: Take 1/2-1 tablet every 6-8 hours as needed for stress    Dispense:  45 tablet    Refill:  1    Return in about 2 months (around 01/30/2015) for recheck.     Kristi Paulita Fujita, M.D. [facility] [address] [place], [place]  [address] [phone] phone [phone] fax

## 2014-11-30 NOTE — Patient Instructions (Signed)

## 2014-12-01 LAB — VITAMIN D 25 HYDROXY (VIT D DEFICIENCY, FRACTURES): Vit D, 25-Hydroxy: 33 ng/mL (ref 30–100)

## 2014-12-02 ENCOUNTER — Telehealth: Payer: Self-pay

## 2014-12-02 NOTE — Telephone Encounter (Signed)
Pharm faxed req for clarification on effexor Rx. The sig states to take 1 tab BID, but quantity is only #30. I read your OV notes which said pt to take 37.5 mg QD, but since Rx directions were for BID, I wanted to check with you to make sure which one is correct? Since you only wrote for #30, I'm thinking you must just want pt to take effexor QD?

## 2014-12-03 LAB — PAP IG AND HPV HIGH-RISK: HPV DNA High Risk: NOT DETECTED

## 2014-12-03 MED ORDER — VENLAFAXINE HCL ER 37.5 MG PO CP24: 37.5000 mg | ORAL_CAPSULE | Freq: Every day | ORAL | Status: DC

## 2014-12-03 NOTE — Telephone Encounter (Signed)
I made error on rx; meant to send in Effexor XR.  Rx corrected and sent to pharmacy.

## 2014-12-27 ENCOUNTER — Ambulatory Visit
Admission: RE | Admit: 2014-12-27 | Discharge: 2014-12-27 | Disposition: A | Discharge status: Home / Self Care | Payer: 59 | Source: Ambulatory Visit | Attending: Family Medicine | Admitting: Family Medicine

## 2014-12-27 DIAGNOSIS — Z1231 Encounter for screening mammogram for malignant neoplasm of breast: Secondary | ICD-10-CM | POA: Insufficient documentation

## 2014-12-27 DIAGNOSIS — Z01419 Encounter for gynecological examination (general) (routine) without abnormal findings: Secondary | ICD-10-CM | POA: Diagnosis present

## 2015-01-03 ENCOUNTER — Ambulatory Visit (INDEPENDENT_AMBULATORY_CARE_PROVIDER_SITE_OTHER): Payer: 59 | Admitting: Urgent Care

## 2015-01-03 VITALS — BP 124/84 | HR 85 | Temp 98.7°F | Resp 16 | Ht 62.0 in | Wt 174.0 lb

## 2015-01-03 DIAGNOSIS — J029 Acute pharyngitis, unspecified: Secondary | ICD-10-CM | POA: Insufficient documentation

## 2015-01-03 LAB — POCT RAPID STREP A (OFFICE): Rapid Strep A Screen: NEGATIVE

## 2015-01-03 NOTE — Progress Notes (Signed)
    MRN: [id number] DOB: [birth year]  Subjective:   Nichole Klein is a 49 y.o. female presenting for chief complaint of Sore Throat and Headache  Reports 2 day history of subjective fever and sore throat, malaise, intermittent headache. Denies sinus congestion, sinus pain, rhinorrhea, itchy watery eyes, red eyes, ear fullness, ear pain, ear drainage, difficulty swallowing, wheezing, shortness of breath, chest tightness and chest pain. Has had 2 sick contacts diagnosed with strep throat. Patient wants to make sure she did not catch Strep from her co-workers. Denies any other aggravating or relieving factors, no other questions or concerns.  Nichole Klein has a current medication list which includes the following prescription(s): ibuprofen, lorazepam, ranitidine, valacyclovir, and venlafaxine xr. She has No Known Allergies.  Nichole Klein  has a past medical history of Anxiety; Arthritis; Herpes labialis; and Hematuria (06/15/2007). Also  has past surgical history that includes Nose surgery and Spine surgery (06/14/2008).  ROS As in subjective.  Objective:   Vitals: BP 124/84 mmHg  Pulse 85  Temp(Src) 98.7 F (37.1 C) (Oral)  Resp 16  Ht  (1.575 m)  Wt 174 lb (78.926 kg)  BMI 31.82 kg/m2  SpO2 97%  LMP 08/17/2013  Physical Exam  Constitutional: She is oriented to person, place, and time. She appears well-developed and well-nourished.  HENT:  TM's intact bilaterally, no effusions or erythema. Nares patent, nasal turbinates pink and moist. No sinus tenderness. Oropharynx clear, mucous membranes moist, dentition in good repair.  Cardiovascular: Normal rate, regular rhythm and intact distal pulses.  Exam reveals no gallop and no friction rub.   No murmur heard. Pulmonary/Chest: No respiratory distress. She has no wheezes. She has no rales.  Musculoskeletal: She exhibits no edema.  Neurological: She is alert and oriented to person, place, and time.  Skin: Skin is warm and dry. No rash noted.  No erythema. No pallor.   Results for orders placed or performed in visit on 01/03/15 (from the past 24 hour(s))  POCT rapid strep A     Status: None   Collection Time: 01/03/15  5:44 PM  Result Value Ref Range   Rapid Strep A Screen Negative Negative   Assessment and Plan :   1. Sore throat 2. Pharyngitis - Likely undergoing viral pharyngitis. Strep culture pending, advised supportive care.  Wallis Bamberg, PA-C [facility] [phone] 01/03/2015 5:45 PM

## 2015-01-03 NOTE — Patient Instructions (Signed)

## 2015-01-05 LAB — CULTURE, GROUP A STREP: ORGANISM ID, BACTERIA: NORMAL

## 2015-01-06 ENCOUNTER — Telehealth: Payer: Self-pay

## 2015-01-06 NOTE — Telephone Encounter (Signed)
Thank you :)

## 2015-01-06 NOTE — Telephone Encounter (Signed)
Pt called about lab results. Let her know her throat culture was negative.

## 2015-01-08 ENCOUNTER — Ambulatory Visit (INDEPENDENT_AMBULATORY_CARE_PROVIDER_SITE_OTHER): Payer: 59 | Admitting: Emergency Medicine

## 2015-01-08 VITALS — BP 126/78 | HR 83 | Temp 98.2°F | Resp 16 | Ht 62.0 in | Wt 177.0 lb

## 2015-01-08 DIAGNOSIS — R509 Fever, unspecified: Secondary | ICD-10-CM

## 2015-01-08 LAB — POCT CBC
GRANULOCYTE PERCENT: 69.5 % (ref 37–80)
HEMATOCRIT: 44.2 % (ref 37.7–47.9)
Hemoglobin: 14.9 g/dL (ref 12.2–16.2)
LYMPH, POC: 2.2 (ref 0.6–3.4)
MCH, POC: 31.1 pg (ref 27–31.2)
MCHC: 33.7 g/dL (ref 31.8–35.4)
MCV: 92.5 fL (ref 80–97)
MID (CBC): 0.7 (ref 0–0.9)
MPV: 8.5 fL (ref 0–99.8)
POC Granulocyte: 6.6 (ref 2–6.9)
POC LYMPH PERCENT: 23.6 %L (ref 10–50)
POC MID %: 6.9 % (ref 0–12)
Platelet Count, POC: 263 10*3/uL (ref 142–424)
RBC: 4.78 M/uL (ref 4.04–5.48)
RDW, POC: 12.8 %
WBC: 9.5 10*3/uL (ref 4.6–10.2)

## 2015-01-08 NOTE — Progress Notes (Addendum)
This chart was scribed for Nichole Chris, MD by Broadus John, Medical Scribe. This patient was seen in Room 10 and the patient's care was started at 9:37 AM.  Chief Complaint:  Chief Complaint  Patient presents with  . Follow-up    HPI: Nichole Klein is a 49 y.o. female who reports to [facility] today for a follow up regarding symptoms of sore throat.  Pt was seen by Wallis Bamberg, PA-C on 01/03/2015 for Sore throat, and Pharyngitis. Pt notes that at her last visit she was presented with symptoms of sore throat, headache, mild cough, myalgia, fatigued, fever. She indicates that she still presents with symptoms of fever, myalgia, and back pain. Pt indicates that two coworkers at her work place were tested positive for Strep. She indicates that she does not want a flu test.  Pt reports that she smokes about a pack a day, however she states that she will be going to counseling sessions offered at her office.    Past Medical History  Diagnosis Date  . Anxiety   . Arthritis     DDD lumbar  . Herpes labialis   . Hematuria 06/15/2007    s/p urology consultation [name]; s/p CT scan negative.  No cystoscopy.   Past Surgical History  Procedure Laterality Date  . Nose surgery    . Spine surgery  06/14/2008    Lumbar surgery L5-S1.  [place].   History   Social History  . Marital Status: Married    Spouse Name: Nichole Klein  . Number of Children: 2  . Years of Education: 15   Occupational History  . CMA    Social History Main Topics  . Smoking status: Current Every Day Smoker -- 1.50 packs/day for 25 years  . Smokeless tobacco: Never Used  . Alcohol Use: No     Comment: special occasions only.  . Drug Use: No  . Sexual Activity: Yes    Birth Control/ Protection: Surgical     Comment: Husband with vasectomy.   Other Topics Concern  . None   Social History Narrative   Marital status: married x 26 years; happily married; no abuse.      Children: two daughters; three  grandchildren.        Employment: [employer] x 2005.  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        Tobacco:  1-1.25  ppd x 20 years.      Alcohol: two to three drinks per month      Drugs: none      Exercise: daily; videos; floor exercises; situps.      Seatbelt:  100%      Guns:  Loaded; secured.   Family History  Problem Relation Age of Onset  . Diabetes Mother   . Hypertension Mother   . Hyperlipidemia Mother   . Hyperlipidemia Father   . Cancer Father     prostate  . Stroke Father     after prostate cancer surgery with post-op pneumonia.  . Aortic stenosis Sister   . Hypertension Sister   . Hyperlipidemia Brother   . Hypertension Brother   . Diabetes Brother   . Hyperlipidemia Brother   . Arthritis Brother   . Hypertension Brother   . Mental illness Brother     anxiety  . Stroke Brother     multiple TIAs.   No Known Allergies Prior to Admission medications   Medication Sig  Start Date End Date Taking? Authorizing Provider  ibuprofen (ADVIL,MOTRIN) 200 MG tablet Take 200 mg by mouth every 6 (six) hours as needed.    Historical Provider, MD  LORazepam (ATIVAN) 0.5 MG tablet Take 1/2-1 tablet every 6-8 hours as needed for stress 11/30/14   Ethelda Chick, MD  ranitidine (ZANTAC) 150 MG tablet Take 150 mg by mouth every morning.    Historical Provider, MD  valACYclovir (VALTREX) 1000 MG tablet 2 PO Q 12 HOURS FOR 1 DAY; START WITH SYMPTOM ONSET 07/02/12   Sondra Barges, PA-C  venlafaxine XR (EFFEXOR-XR) 37.5 MG 24 hr capsule Take 1 capsule (37.5 mg total) by mouth daily with breakfast. Patient not taking: Reported on 01/03/2015 12/03/14   Ethelda Chick, MD     ROS: The patient has Fever, myalgia, back pain.   All other systems have been reviewed and were otherwise negative with the exception of those mentioned in the HPI and as above.    PHYSICAL EXAM: Filed Vitals:   01/08/15 0913  BP: 126/78  Pulse: 83  Temp: 98.2 F  (36.8 C)  Resp: 16   Body mass index is 32.37 kg/(m^2).   General: Alert, no acute distress HEENT:  Normocephalic, atraumatic, oropharynx patent. Eye: Nonie Hoyer [facility] Cardiovascular:  Regular rate and rhythm, no rubs murmurs or gallops.  No Carotid bruits, radial pulse intact. No pedal edema.  Respiratory: Clear to auscultation bilaterally.  No wheezes, rales, or rhonchi.  No cyanosis, no use of accessory musculature Abdominal: No organomegaly, abdomen is soft and non-tender, positive bowel sounds.  No masses. Musculoskeletal: Gait intact. No edema, tenderness Skin: No rashes. Neurologic: Facial musculature symmetric. Psychiatric: Patient acts appropriately throughout our interaction. Lymphatic: No cervical or submandibular lymphadenopathy Genitourinary/Anorectal: No acute findings    LABS: Results for orders placed or performed in visit on 01/03/15  Throat culture Loney Loh)  Result Value Ref Range   Organism ID, Bacteria Normal Upper Respiratory Flora    Organism ID, Bacteria No Beta Hemolytic Streptococci Isolated   POCT rapid strep A  Result Value Ref Range   Rapid Strep A Screen Negative Negative   Results for orders placed or performed in visit on 01/08/15  POCT CBC  Result Value Ref Range   WBC 9.5 4.6 - 10.2 K/uL   Lymph, poc 2.2 0.6 - 3.4   POC LYMPH PERCENT 23.6 10 - 50 %L   MID (cbc) 0.7 0 - 0.9   POC MID % 6.9 0 - 12 %M   POC Granulocyte 6.6 2 - 6.9   Granulocyte percent 69.5 37 - 80 %G   RBC 4.78 4.04 - 5.48 M/uL   Hemoglobin 14.9 12.2 - 16.2 g/dL   HCT, POC 81.1 91.4 - 47.9 %   MCV 92.5 80 - 97 fL   MCH, POC 31.1 27 - 31.2 pg   MCHC 33.7 31.8 - 35.4 g/dL   RDW, POC 78.2 %   Platelet Count, POC 263 142 - 424 K/uL   MPV 8.5 0 - 99.8 fL     EKG/XRAY:   Primary read interpreted by Dr. Cleta Alberts at [facility].   ASSESSMENT/PLAN: Patient is doing well with her symptoms. Strep test was negative. White count is normal. Will give a note until Monday.  Gross  sideeffects, risk and benefits, and alternatives of medications d/w patient. Patient is aware that all medications have potential sideeffects and we are unable to predict every sideeffect or drug-drug interaction that may occur.  Nichole Chris MD 01/08/2015 9:37  AM     

## 2015-03-15 ENCOUNTER — Ambulatory Visit (INDEPENDENT_AMBULATORY_CARE_PROVIDER_SITE_OTHER): Payer: 59

## 2015-03-15 ENCOUNTER — Ambulatory Visit (INDEPENDENT_AMBULATORY_CARE_PROVIDER_SITE_OTHER): Payer: 59 | Admitting: Family Medicine

## 2015-03-15 VITALS — BP 130/80 | HR 94 | Temp 98.1°F | Resp 18 | Ht 62.0 in | Wt 180.0 lb

## 2015-03-15 DIAGNOSIS — M6248 Contracture of muscle, other site: Secondary | ICD-10-CM

## 2015-03-15 DIAGNOSIS — M542 Cervicalgia: Secondary | ICD-10-CM

## 2015-03-15 DIAGNOSIS — M62838 Other muscle spasm: Secondary | ICD-10-CM

## 2015-03-15 MED ORDER — HYDROCODONE-ACETAMINOPHEN 5-325 MG PO TABS: 1.0000 | ORAL_TABLET | Freq: Four times a day (QID) | ORAL | Status: DC | PRN

## 2015-03-15 MED ORDER — CYCLOBENZAPRINE HCL 5 MG PO TABS: 5.0000 mg | ORAL_TABLET | Freq: Three times a day (TID) | ORAL | Status: DC | PRN

## 2015-03-15 MED ORDER — MELOXICAM 7.5 MG PO TABS: 7.5000 mg | ORAL_TABLET | Freq: Every day | ORAL | Status: DC

## 2015-03-15 NOTE — Progress Notes (Signed)
Subjective:    Patient ID: Nichole Klein, female    DOB: 10/12/1965, 49 y.o.   MRN: [id number] This chart was scribed for Nichole Staggers, MD by Littie Deeds, Medical Scribe. This patient was seen in Room 1 and the patient's care was started at 1:33 PM.   HPI HPI Comments: Nichole Klein is a 49 y.o. female who presents to [facility] complaining of gradual onset right shoulder pain that started 3 weeks ago, but worsened over the past 4 days. Patient has been using flexeril 5 mg (1 every 6 hours, 3 times a day) and ibuprofen (800 mg in the morning, 400 mg at night) as well as heat but without relief. She has also tried neck exercises and relaxation techniques. The pain is improved with certain motions. She has had difficulty sleeping due to the pain. She has had increased stress recently because she has been taking care of her 26 year old mother without the help of her siblings. Patient denies weakness and numbness into the hand. She also denies injury. She has had XR imaging of her neck here several years ago and was told she had some cervical spine straightening.   Patient Active Problem List   Diagnosis Date Noted  . Pharyngitis 01/03/2015  . Need for prophylactic vaccination against Streptococcus pneumoniae (pneumococcus) 07/10/2012  . Routine gynecological examination 07/10/2012  . Routine general medical examination at a health care facility 07/10/2012  . Chest pain   . Anxiety   . Tobacco abuse 07/14/2011   Past Medical History  Diagnosis Date  . Anxiety   . Arthritis     DDD lumbar  . Herpes labialis   . Hematuria 06/15/2007    s/p urology consultation [name]; s/p CT scan negative.  No cystoscopy.   Past Surgical History  Procedure Laterality Date  . Nose surgery    . Spine surgery  06/14/2008    Lumbar surgery L5-S1.  [place].   No Known Allergies Prior to Admission medications   Medication Sig Start Date End Date Taking? Authorizing  Provider  cyclobenzaprine (FLEXERIL) 5 MG tablet Take 5 mg by mouth 3 (three) times daily as needed for muscle spasms.   Yes Historical Provider, MD  ibuprofen (ADVIL,MOTRIN) 200 MG tablet Take 200 mg by mouth every 6 (six) hours as needed.   Yes Historical Provider, MD  LORazepam (ATIVAN) 0.5 MG tablet Take 1/2-1 tablet every 6-8 hours as needed for stress 11/30/14  Yes Ethelda Chick, MD  omeprazole (PRILOSEC) 40 MG capsule Take 40 mg by mouth daily.   Yes Historical Provider, MD  ranitidine (ZANTAC) 150 MG tablet Take 150 mg by mouth every morning.    Historical Provider, MD  valACYclovir (VALTREX) 1000 MG tablet 2 PO Q 12 HOURS FOR 1 DAY; START WITH SYMPTOM ONSET Patient not taking: Reported on 03/15/2015 07/02/12   [name], PA-C  venlafaxine XR (EFFEXOR-XR) 37.5 MG 24 hr capsule Take 1 capsule (37.5 mg total) by mouth daily with breakfast. Patient not taking: Reported on 01/03/2015 12/03/14   Ethelda Chick, MD   Social History   Social History  . Marital Status: Married    Spouse Name: [name]  . Number of Children: 2  . Years of Education: 15   Occupational History  . CMA    Social History Main Topics  . Smoking status: Current Every Day Smoker -- 1.50 packs/day for 25 years  . Smokeless tobacco: Never Used  . Alcohol Use:  No     Comment: special occasions only.  . Drug Use: No  . Sexual Activity: Yes    Birth Control/ Protection: Surgical     Comment: Husband with vasectomy.   Other Topics Concern  . Not on file   Social History Narrative   Marital status: married x 26 years; happily married; no abuse.      Children: two daughters; three grandchildren.        Employment: [employer] x 2005.  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        Tobacco:  1-1.25  ppd x 20 years.      Alcohol: two to three drinks per month      Drugs: none      Exercise: daily; videos; floor exercises; situps.      Seatbelt:  100%      Guns:   Loaded; secured.     Review of Systems  Musculoskeletal: Positive for arthralgias.  Neurological: Negative for weakness and numbness.       Objective:   Physical Exam  Constitutional: She is oriented to person, place, and time. She appears well-developed and well-nourished. No distress.  HENT:  Head: Normocephalic and atraumatic.  Mouth/Throat: Oropharynx is clear and moist. No oropharyngeal exudate.  Eyes: Pupils are equal, round, and reactive to light.  Neck: Neck supple.  Some reproduction of her right upper shoulder symptoms with lower cervical spine midline palpation, but no focal tenderness. Reproduction with flexion and extension, but ROM is full. Decreased left rotation with reproduction of symptoms. Slight decreased left lateroflexion.  Cardiovascular: Normal rate.   Pulmonary/Chest: Effort normal.  Musculoskeletal: She exhibits no edema.  Palpable spasm of her right trapezius. [place], AC and clavicle non-tender. Negative Neer. Negative Hawkins-Kennedy. Negative O'Brien.  Neurological: She is alert and oriented to person, place, and time. No cranial nerve deficit.  Reflex Scores:      Tricep reflexes are 2+ on the right side and 2+ on the left side.      Bicep reflexes are 2+ on the right side and 2+ on the left side.      Brachioradialis reflexes are 2+ on the right side and 2+ on the left side. Strength equal upper extremities bilaterally. Equal grip strength. Full ROM of right shoulder. Negative liftoff. Full rotator cuff strength. Negative empty can.   Skin: Skin is warm and dry. No rash noted.  Psychiatric: She has a normal mood and affect. Her behavior is normal.  Nursing note and vitals reviewed.  [employer] (PRIMARY) x-ray report read by Dr. Neva Seat: Cervical spine - straightening with decreasing lordosis. Overall disc space appears to be intact. No apparent significant foramina narrowing.   Filed Vitals:   03/15/15 1301  BP: 130/80  Pulse: 94  Temp: 98.1 F (36.7 C)    TempSrc: Oral  Resp: 18  Height:  (1.575 m)  Weight: 180 lb (81.647 kg)  SpO2: 98%       Assessment & Plan:   Nichole Klein is a 49 y.o. 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 Inadequate relief with over-the-counter NSAID and 5 mg Flexeril.  -Increase Flexeril to 5-10 mg every 8 hours when necessary. Side effects discussed.  -Start Mobic 7.5-15 mg  daily when necessary  -Heat or ice to area, massage okay if it helps symptoms.  -Short course of hydrocodone 5 mg for breakthrough pain at night, but advised to try above treatment first, then cautioned on combination of sedating medications.  -Consider prednisone trial or orthopedic evaluation if symptoms persist. RTC precautions.  Meds ordered this encounter  Medications  . omeprazole (PRILOSEC) 40 MG capsule    Sig: Take 40 mg by mouth daily.  Marland Kitchen DISCONTD: cyclobenzaprine (FLEXERIL) 5 MG tablet    Sig: Take 5 mg by mouth 3 (three) times daily as needed for muscle spasms.  . cyclobenzaprine (FLEXERIL) 5 MG tablet    Sig: Take 1-2 tablets (5-10 mg total) by mouth 3 (three) times daily as needed.    Dispense:  30 tablet    Refill:  0  . meloxicam (MOBIC) 7.5 MG tablet    Sig: Take 1-2 tablets (7.5-15 mg total) by mouth daily.    Dispense:  30 tablet    Refill:  0  . HYDROcodone-acetaminophen (NORCO/VICODIN) 5-325 MG tablet    Sig: Take 1 tablet by mouth every 6 (six) hours as needed for moderate pain.    Dispense:  10 tablet    Refill:  0   Patient Instructions   X-rays overall appear to be okay. Can start with Flexeril 2 at a time if needed, up to every 8 hours. Can take meloxicam 1-2 once per day. Do not combine with other  anti-inflammatories like Advil or Aleve. If needed for breakthrough pain, I prescribed a few hydrocodone, but do not combine these with other sedating medicines if possible. If you do combine hydrocodone with the Flexeril, be careful as these can cause oversedation.  Continue stretches and range of motion, heat and ice to area as discussed.  If not improving in the next week to 10 days, consider Ortho evaluation or  possibly try prednisone for 1 course. Return to the clinic or go to the nearest emergency room if any of your symptoms worsen or new symptoms occur.  Muscle Cramps and Spasms Muscle cramps and spasms occur when a muscle or muscles tighten and you have no control over this tightening (involuntary muscle contraction). They are a common problem and can develop in any muscle. The most common place is in the calf muscles of the leg. Both muscle cramps and muscle spasms are involuntary muscle contractions, but they also have differences:   Muscle cramps are sporadic and painful. They may last a few seconds to a quarter of an hour. Muscle cramps are often more forceful and last longer than muscle spasms.  Muscle spasms may or may not be painful. They may also last just a few seconds or much longer. CAUSES  It is uncommon for cramps or spasms to be due to a serious underlying problem. In many cases, the cause of cramps or spasms is unknown. Some common causes are:   Overexertion.   Overuse from repetitive motions (doing the same thing over and over).   Remaining in a certain position for a long period of time.   Improper preparation, form, or technique while performing a sport or activity.   Dehydration.   Injury.   Side effects of some medicines.   Abnormally low levels of the salts and ions in your blood (electrolytes), especially potassium and calcium. This could happen if you are taking water pills (diuretics) or you are pregnant.  Some underlying medical problems can make  it more likely to develop cramps  or spasms. These include, but are not limited to:   Diabetes.   Parkinson disease.   Hormone disorders, such as thyroid problems.   Alcohol abuse.   Diseases specific to muscles, joints, and bones.   Blood vessel disease where not enough blood is getting to the muscles.  HOME CARE INSTRUCTIONS   Stay well hydrated. Drink enough water and fluids to keep your urine clear or pale yellow.  It may be helpful to massage, stretch, and relax the affected muscle.  For tight or tense muscles, use a warm towel, heating pad, or hot shower water directed to the affected area.  If you are sore or have pain after a cramp or spasm, applying ice to the affected area may relieve discomfort.  Put ice in a plastic bag.  Place a towel between your skin and the bag.  Leave the ice on for 15-20 minutes, 03-04 times a day.  Medicines used to treat a known cause of cramps or spasms may help reduce their frequency or severity. Only take over-the-counter or prescription medicines as directed by your caregiver. SEEK MEDICAL CARE IF:  Your cramps or spasms get more severe, more frequent, or do not improve over time.  MAKE SURE YOU:   Understand these instructions.  Will watch your condition.  Will get help right away if you are not doing well or get worse. Document Released: 11/20/2001 Document Revised: 09/25/2012 Document Reviewed: 05/17/2012 [facility] Patient Information 2015 [place], [place]. This information is not intended to replace advice given to you by your health care provider. Make sure you discuss any questions you have with your health care provider.      I personally performed the services described in this documentation, which was scribed in my presence. The recorded information has been reviewed and considered, and addended by me as needed.    By signing my name below, I, Littie Deeds, attest that this documentation has been prepared under the  direction and in the presence of Nichole Staggers, MD.  Electronically Signed: Littie Deeds, Medical Scribe. 03/15/2015. 1:33 PM.

## 2015-03-15 NOTE — Patient Instructions (Addendum)
X-rays overall appear to be okay. Can start with Flexeril 2 at a time if needed, up to every 8 hours. Can take meloxicam 1-2 once per day. Do not combine with other anti-inflammatories like Advil or Aleve. If needed for breakthrough pain, I prescribed a few hydrocodone, but do not combine these with other sedating medicines if possible. If you do combine hydrocodone with the Flexeril, be careful as these can cause oversedation.  Continue stretches and range of motion, heat and ice to area as discussed.  If not improving in the next week to 10 days, consider Ortho evaluation or  possibly try prednisone for 1 course. Return to the clinic or go to the nearest emergency room if any of your symptoms worsen or new symptoms occur.  Muscle Cramps and Spasms Muscle cramps and spasms occur when a muscle or muscles tighten and you have no control over this tightening (involuntary muscle contraction). They are a common problem and can develop in any muscle. The most common place is in the calf muscles of the leg. Both muscle cramps and muscle spasms are involuntary muscle contractions, but they also have differences:   Muscle cramps are sporadic and painful. They may last a few seconds to a quarter of an hour. Muscle cramps are often more forceful and last longer than muscle spasms.  Muscle spasms may or may not be painful. They may also last just a few seconds or much longer. CAUSES  It is uncommon for cramps or spasms to be due to a serious underlying problem. In many cases, the cause of cramps or spasms is unknown. Some common causes are:   Overexertion.   Overuse from repetitive motions (doing the same thing over and over).   Remaining in a certain position for a long period of time.   Improper preparation, form, or technique while performing a sport or activity.   Dehydration.   Injury.   Side effects of some medicines.   Abnormally low levels of the salts and ions in your blood  (electrolytes), especially potassium and calcium. This could happen if you are taking water pills (diuretics) or you are pregnant.  Some underlying medical problems can make it more likely to develop cramps or spasms. These include, but are not limited to:   Diabetes.   Parkinson disease.   Hormone disorders, such as thyroid problems.   Alcohol abuse.   Diseases specific to muscles, joints, and bones.   Blood vessel disease where not enough blood is getting to the muscles.  HOME CARE INSTRUCTIONS   Stay well hydrated. Drink enough water and fluids to keep your urine clear or pale yellow.  It may be helpful to massage, stretch, and relax the affected muscle.  For tight or tense muscles, use a warm towel, heating pad, or hot shower water directed to the affected area.  If you are sore or have pain after a cramp or spasm, applying ice to the affected area may relieve discomfort.  Put ice in a plastic bag.  Place a towel between your skin and the bag.  Leave the ice on for 15-20 minutes, 03-04 times a day.  Medicines used to treat a known cause of cramps or spasms may help reduce their frequency or severity. Only take over-the-counter or prescription medicines as directed by your caregiver. SEEK MEDICAL CARE IF:  Your cramps or spasms get more severe, more frequent, or do not improve over time.  MAKE SURE YOU:   Understand these instructions.  Will  watch your condition.  Will get help right away if you are not doing well or get worse. Document Released: 11/20/2001 Document Revised: 09/25/2012 Document Reviewed: 05/17/2012 [facility] Patient Information 2015 [place], [place]. This information is not intended to replace advice given to you by your health care provider. Make sure you discuss any questions you have with your health care provider.

## 2015-03-20 ENCOUNTER — Telehealth: Payer: Self-pay

## 2015-03-20 NOTE — Telephone Encounter (Signed)
Pt states that she saw dr Neva Seat on Saturday for shoulder pain and was given a small amount of pain pills-she  Still has about 5 but would like a refill since she is still having shoulder pain and is going out of town this weekend and does not want to run out

## 2015-03-20 NOTE — Telephone Encounter (Signed)
Please advise for Dr. Neva Seat.

## 2015-03-21 ENCOUNTER — Other Ambulatory Visit: Payer: Self-pay | Admitting: Physician Assistant

## 2015-03-21 MED ORDER — PREDNISONE 20 MG PO TABS: 40.0000 mg | ORAL_TABLET | Freq: Every day | ORAL | Status: DC

## 2015-03-21 NOTE — Telephone Encounter (Signed)
Per Dr. Paralee Cancel note the next step is prednisone.  No more opioids for now.  If prednisone fails will send to ortho. Deliah Boston, MS, PA-C   10:12 AM, 03/21/2015

## 2015-04-06 ENCOUNTER — Ambulatory Visit (INDEPENDENT_AMBULATORY_CARE_PROVIDER_SITE_OTHER): Payer: 59 | Admitting: Internal Medicine

## 2015-04-06 VITALS — BP 126/84 | HR 99 | Temp 98.3°F | Resp 18 | Ht 61.25 in | Wt 178.4 lb

## 2015-04-06 DIAGNOSIS — M542 Cervicalgia: Secondary | ICD-10-CM | POA: Diagnosis not present

## 2015-04-06 DIAGNOSIS — M62838 Other muscle spasm: Secondary | ICD-10-CM

## 2015-04-06 DIAGNOSIS — M6248 Contracture of muscle, other site: Secondary | ICD-10-CM

## 2015-04-06 MED ORDER — HYDROCODONE-ACETAMINOPHEN 5-325 MG PO TABS: ORAL_TABLET | ORAL | Status: DC

## 2015-04-06 MED ORDER — METHYLPREDNISOLONE ACETATE 80 MG/ML IJ SUSP
80.0000 mg | Freq: Once | INTRAMUSCULAR | Status: AC
  Administered 2015-04-06: 80 mg via INTRAMUSCULAR

## 2015-04-06 MED ORDER — CYCLOBENZAPRINE HCL 5 MG PO TABS: 5.0000 mg | ORAL_TABLET | Freq: Three times a day (TID) | ORAL | Status: DC | PRN

## 2015-04-06 MED ORDER — MELOXICAM 15 MG PO TABS: 15.0000 mg | ORAL_TABLET | Freq: Every day | ORAL | Status: DC
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
## 2015-06-17 ENCOUNTER — Ambulatory Visit (INDEPENDENT_AMBULATORY_CARE_PROVIDER_SITE_OTHER): Payer: 59

## 2015-06-17 ENCOUNTER — Ambulatory Visit (INDEPENDENT_AMBULATORY_CARE_PROVIDER_SITE_OTHER): Payer: 59 | Admitting: Family Medicine

## 2015-06-17 VITALS — BP 120/72 | HR 81 | Temp 98.0°F | Resp 20 | Ht 61.5 in | Wt 180.6 lb

## 2015-06-17 DIAGNOSIS — R05 Cough: Secondary | ICD-10-CM

## 2015-06-17 DIAGNOSIS — R0989 Other specified symptoms and signs involving the circulatory and respiratory systems: Secondary | ICD-10-CM

## 2015-06-17 DIAGNOSIS — F17200 Nicotine dependence, unspecified, uncomplicated: Secondary | ICD-10-CM | POA: Diagnosis not present

## 2015-06-17 DIAGNOSIS — R062 Wheezing: Secondary | ICD-10-CM

## 2015-06-17 DIAGNOSIS — R059 Cough, unspecified: Secondary | ICD-10-CM

## 2015-06-17 LAB — POCT CBC
Granulocyte percent: 58.1 %G (ref 37–80)
HEMATOCRIT: 43.3 % (ref 37.7–47.9)
Hemoglobin: 14.6 g/dL (ref 12.2–16.2)
LYMPH, POC: 3.9 — AB (ref 0.6–3.4)
MCH, POC: 31.3 pg — AB (ref 27–31.2)
MCHC: 33.8 g/dL (ref 31.8–35.4)
MCV: 92.6 fL (ref 80–97)
MID (cbc): 0.6 (ref 0–0.9)
MPV: 8.2 fL (ref 0–99.8)
PLATELET COUNT, POC: 298 10*3/uL (ref 142–424)
POC Granulocyte: 6.2 (ref 2–6.9)
POC LYMPH %: 36.5 % (ref 10–50)
POC MID %: 5.4 %M (ref 0–12)
RBC: 4.68 M/uL (ref 4.04–5.48)
RDW, POC: 13.2 %
WBC: 10.7 10*3/uL — AB (ref 4.6–10.2)

## 2015-06-17 MED ORDER — HYDROCODONE-HOMATROPINE 5-1.5 MG/5ML PO SYRP: 5.0000 mL | ORAL_SOLUTION | ORAL | Status: DC | PRN

## 2015-06-17 MED ORDER — ALBUTEROL SULFATE HFA 108 (90 BASE) MCG/ACT IN AERS: 2.0000 | INHALATION_SPRAY | RESPIRATORY_TRACT | Status: DC | PRN

## 2015-06-17 MED ORDER — AZITHROMYCIN 250 MG PO TABS: ORAL_TABLET | ORAL | Status: DC

## 2015-06-17 NOTE — Patient Instructions (Signed)
Drink plenty of fluids and get enough rest  Stay off work at least through tomorrow. If you're still significantly ill by Thursday extend that another day.  Take azithromycin 2 pills initially then 1 daily for 4 days  Use the Hycodan cough syrup 1 teaspoon every 4-6 hours as needed for bad cough  Take the albuterol inhaler 2 inhalations every 4-6 hours for coughing and wheezing  Refrain from smoking. Encourage you to quit still.  Return if not improving or if getting increasingly short of breath. Go to the emergency room severely short of breath at anytime.

## 2015-06-17 NOTE — Progress Notes (Signed)
Patient ID: Nichole Klein, female    DOB: [birth year]  Age: 50 y.o. MRN: [id number]  Chief Complaint  Patient presents with  . Cough    x 2 week  . Fever  . Weakness  . Headache  . 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 She has been sick stay with a respiratory tract infection. She had high fevers. She did have a flu shot this year. She has had a persistent cough. She feels lousy. Her head and chest are congested but she is not bringing up a lot of blowing out a lot. She still smokes about a pack of cigarettes per day. Not on any other major medications except for taking some stuff for her neck.  Current allergies, medications, problem list, past/family and social histories reviewed.  Objective:  BP 120/72 mmHg  Pulse 81  Temp(Src) 98 F (36.7 C) (Oral)  Resp 20  Ht 5' 1.5" (1.562 m)  Wt 180 lb 9.6 oz (81.92 kg)  BMI 33.58 kg/m2  SpO2 96%  Looks like she feels ill. Coughing and a little raspy. Her TMs are normal. Throat clear. Neck supple without significant chest has scattered rhonchi and wheezes throughout both lung fields. Heart regular without murmurs.  Assessment & Plan:   Assessment: 1. Cough   2. Wheezing   3. Chest congestion   4. Tobacco use disorder       Plan: Chest x-ray and CBC. Patient doesn't do real well with albuterol nebulizer so will hold off on that for now.   [facility] reading (PRIMARY) by  Dr. Alwyn Ren Normal chest x-ray  Results for orders placed or performed in visit on 06/17/15  POCT CBC  Result Value Ref Range   WBC 10.7 (A) 4.6 - 10.2 K/uL   Lymph, poc 3.9 (A) 0.6 - 3.4   POC LYMPH PERCENT 36.5 10 - 50 %L   MID (cbc) 0.6 0 - 0.9   POC MID % 5.4 0 - 12 %M   POC Granulocyte 6.2 2 - 6.9   Granulocyte percent 58.1 37 - 80 %G   RBC 4.68 4.04 - 5.48 M/uL   Hemoglobin 14.6 12.2 - 16.2 g/dL   HCT, POC 40.9 81.1 - 47.9 %   MCV 92.6 80 - 97 fL   MCH, POC  31.3 (A) 27 - 31.2 pg   MCHC 33.8 31.8 - 35.4 g/dL   RDW, POC 91.4 %   Platelet Count, POC 298 142 - 424 K/uL   MPV 8.2 0 - 99.8 fL   .   Orders Placed This Encounter  Procedures  . DG Chest 2 View    Order Specific Question:  Reason for Exam (SYMPTOM  OR DIAGNOSIS REQUIRED)    Answer:  cough    Order Specific Question:  Is the patient pregnant?    Answer:  No    Order Specific Question:  Preferred imaging location?    Answer:  External  . POCT CBC    Meds ordered this encounter  Medications  . HYDROcodone-homatropine (HYCODAN) 5-1.5 MG/5ML syrup    Sig: Take 5 mLs by mouth every 4 (four) hours as needed.    Dispense:  120 mL    Refill:  0  . albuterol (PROVENTIL HFA;VENTOLIN HFA) 108 (90 Base) MCG/ACT inhaler    Sig: Inhale 2 puffs into the lungs every 4 (four) hours as needed for wheezing or shortness of breath (  cough, shortness of breath or wheezing.).    Dispense:  1 Inhaler    Refill:  1  . azithromycin (ZITHROMAX) 250 MG tablet    Sig: Take 2 tabs PO x 1 dose, then 1 tab PO QD x 4 days    Dispense:  6 tablet    Refill:  0         Patient Instructions  Drink plenty of fluids and get enough rest  Stay off work at least through tomorrow. If you're still significantly ill by Thursday extend that another day.  Take azithromycin 2 pills initially then 1 daily for 4 days  Use the Hycodan cough syrup 1 teaspoon every 4-6 hours as needed for bad cough  Take the albuterol inhaler 2 inhalations every 4-6 hours for coughing and wheezing  Refrain from smoking. Encourage you to quit still.  Return if not improving or if getting increasingly short of breath. Go to the emergency room severely short of breath at anytime.     Return if symptoms worsen or fail to improve.   Kamylle Axelson, MD 06/17/2015

## 2015-08-25 ENCOUNTER — Ambulatory Visit (INDEPENDENT_AMBULATORY_CARE_PROVIDER_SITE_OTHER): Payer: 59

## 2015-08-25 ENCOUNTER — Ambulatory Visit (INDEPENDENT_AMBULATORY_CARE_PROVIDER_SITE_OTHER): Payer: 59 | Admitting: Physician Assistant

## 2015-08-25 VITALS — BP 118/64 | HR 82 | Temp 98.3°F | Resp 16 | Ht 61.0 in | Wt 180.0 lb

## 2015-08-25 DIAGNOSIS — M79671 Pain in right foot: Secondary | ICD-10-CM

## 2015-08-25 DIAGNOSIS — M7989 Other specified soft tissue disorders: Secondary | ICD-10-CM | POA: Diagnosis not present

## 2015-08-25 DIAGNOSIS — M25571 Pain in right ankle and joints of right foot: Secondary | ICD-10-CM | POA: Diagnosis not present

## 2015-08-25 NOTE — Progress Notes (Addendum)
[facility] [address] [place] [address] [id number]  Date:  08/25/2015   Name:  Nichole Klein   DOB:  28-May-1966   MRN:  [id number]  PCP:  Nilda Simmer, MD    History of Present Illness:  Nichole Klein is a 50 y.o. female patient who presents to [facility] with cc of 4 days of foot pain of right foot.  She was in [place] walking in lace up leather shoes for a long period.  The next day she had pain in the bottom of her foot and ankle.  Over these last days it has become significantly worse.  She has noticed pain along the ankle and extending underneath her foot and wrapping around to the other side.  She also has some pain extending to the lateral side of her lower leg.  She has noticed some swelling to the lateral ankle and front of her foot.  No numbness or tingling.  She has done no thermotherapy, but had taken ibuprofen which helps a little.    Patient Active Problem List   Diagnosis Date Noted  . Pharyngitis 01/03/2015  . Need for prophylactic vaccination against Streptococcus pneumoniae (pneumococcus) 07/10/2012  . Routine gynecological examination 07/10/2012  . Routine general medical examination at a health care facility 07/10/2012  . Chest pain   . Anxiety   . Tobacco abuse 07/14/2011    Past Medical History  Diagnosis Date  . Anxiety   . Arthritis     DDD lumbar  . Herpes labialis   . Hematuria 06/15/2007    s/p urology consultation [name]; s/p CT scan negative.  No cystoscopy.    Past Surgical History  Procedure Laterality Date  . Nose surgery    . Spine surgery  06/14/2008    Lumbar surgery L5-S1.  [place].    Social History  Substance Use Topics  . Smoking status: Current Every Day Smoker -- 1.50 packs/day for 25 years  . Smokeless tobacco: Never Used  . Alcohol Use: No     Comment: special occasions only.    Family History  Problem Relation Age of Onset  . Diabetes Mother   . Hypertension Mother   . Hyperlipidemia  Mother   . Hyperlipidemia Father   . Cancer Father     prostate  . Stroke Father     after prostate cancer surgery with post-op pneumonia.  . Aortic stenosis Sister   . Hypertension Sister   . Hyperlipidemia Brother   . Hypertension Brother   . Diabetes Brother   . Hyperlipidemia Brother   . Arthritis Brother   . Hypertension Brother   . Mental illness Brother     anxiety  . Stroke Brother     multiple TIAs.    No Known Allergies  Medication list has been reviewed and updated.  Current Outpatient Prescriptions on File Prior to Visit  Medication Sig Dispense Refill  . HYDROcodone-acetaminophen (NORCO/VICODIN) 5-325 MG tablet Hs prn 20 tablet 0  . ibuprofen (ADVIL,MOTRIN) 200 MG tablet Take 200 mg by mouth every 6 (six) hours as needed.    Marland Kitchen omeprazole (PRILOSEC) 40 MG capsule Take 40 mg by mouth daily. Reported on 08/25/2015    . ranitidine (ZANTAC) 150 MG tablet Take 150 mg by mouth every morning.    Marland Kitchen albuterol (PROVENTIL HFA;VENTOLIN HFA) 108 (90 Base) MCG/ACT inhaler Inhale 2 puffs into the lungs every 4 (four) hours as needed for wheezing or shortness of breath (cough, shortness  of breath or wheezing.). (Patient not taking: Reported on 08/25/2015) 1 Inhaler 1  . cyclobenzaprine (FLEXERIL) 5 MG tablet Take 1-2 tablets (5-10 mg total) by mouth 3 (three) times daily as needed. (Patient not taking: Reported on 06/17/2015) 90 tablet 2  . HYDROcodone-homatropine (HYCODAN) 5-1.5 MG/5ML syrup Take 5 mLs by mouth every 4 (four) hours as needed. (Patient not taking: Reported on 08/25/2015) 120 mL 0  . LORazepam (ATIVAN) 0.5 MG tablet Take 1/2-1 tablet every 6-8 hours as needed for stress (Patient not taking: Reported on 06/17/2015) 45 tablet 1  . meloxicam (MOBIC) 15 MG tablet Take 1 tablet (15 mg total) by mouth daily. (Patient not taking: Reported on 06/17/2015) 30 tablet 3  . predniSONE (DELTASONE) 20 MG tablet Take 2 tablets (40 mg total) by mouth daily with breakfast. Do not take Meloxicam,  Ibuprofen, or Aleve or Goody's while taking this med. (Patient not taking: Reported on 08/25/2015) 10 tablet 0   No current facility-administered medications on file prior to visit.    ROS ROS otherwise unremarkable unless listed above.   Physical Examination: BP 118/64 mmHg  Pulse 82  Temp(Src) 98.3 F (36.8 C) (Oral)  Resp 16  Ht 5\' 1"  (1.549 m)  Wt 180 lb (81.647 kg)  BMI 34.03 kg/m2  SpO2 98% Ideal Body Weight: Weight in (lb) to have BMI = 25: 132  Physical Exam  Constitutional: She is oriented to person, place, and time. She appears well-developed and well-nourished. No distress.  HENT:  Head: Normocephalic and atraumatic.  Right Ear: External ear normal.  Left Ear: External ear normal.  Eyes: Conjunctivae and EOM are normal. Pupils are equal, round, and reactive to light.  Cardiovascular: Normal rate.   Pulmonary/Chest: Effort normal. No respiratory distress.  Musculoskeletal:       Right ankle: She exhibits swelling (anterior to the lateral malleolus is minimal swelling and tenderness.). She exhibits normal range of motion, no deformity and normal pulse. Tenderness. Lateral malleolus tenderness found. No medial malleolus, no AITFL and no posterior TFL tenderness found. Achilles tendon normal. Achilles tendon exhibits normal Thompson's test results.  Pain with plantarflexion and eversion, though no decrease rom.  Lateral plantar minimal erythema.  Tenderness just anterior to the plantar midfoot along 4th and fifth metatarsal.    Neurological: She is alert and oriented to person, place, and time.  Skin: She is not diaphoretic.  Psychiatric: She has a normal mood and affect. Her behavior is normal.    Dg Ankle Complete Right  08/25/2015  CLINICAL DATA:  Pain and swelling for 4 days EXAM: RIGHT ANKLE - COMPLETE 3+ VIEW COMPARISON:  None. FINDINGS: Frontal, oblique, and lateral views were obtained. There is slight swelling laterally. No fracture or joint effusion. Ankle mortise  appears intact. No appreciable joint space narrowing. IMPRESSION: Mild swelling laterally. No fracture. Mortise intact. No appreciable arthropathic change. Electronically Signed   By: Bretta BangWilliam  Woodruff III M.D.   On: 08/25/2015 13:02   Dg Foot Complete Right  08/25/2015  CLINICAL DATA:  [place]alking [place] 4 [place] ago, now with pain and swelling involving in the dorsal aspect of the foot and ankle. EXAM: RIGHT FOOT COMPLETE - 3+ VIEW COMPARISON:  None. FINDINGS: No fracture or dislocation. Joint spaces are preserved. No significant hallux valgus deformity. No erosions. Regional soft tissues appear normal. No plantar calcaneal spur. IMPRESSION: No explanation for patient's foot pain and swelling. Electronically Signed   By: Simonne ComeJohn  Watts M.D.   On: 08/25/2015 12:53    Assessment  and Plan: Nichole Klein is a 50 y.o. female who is here today for right foot pain.  This appears to be a strain.   -advised ice, compression, elevation, and rest.  We had discussed placing a boot on the foot as to help with ambulation, but she did not wait to receive orthopedic material nor information of stretches.  This was due to a wait to obtain these in office.  Foot pain, right - Plan: DG Foot Complete Right, DG Ankle Complete Right    Trena Platt, PA-C [facility] 08/25/2015 12:18 PM

## 2015-11-14 ENCOUNTER — Encounter: Payer: 59 | Admitting: Family Medicine

## 2015-12-02 ENCOUNTER — Ambulatory Visit (INDEPENDENT_AMBULATORY_CARE_PROVIDER_SITE_OTHER): Payer: 59

## 2015-12-02 ENCOUNTER — Ambulatory Visit (INDEPENDENT_AMBULATORY_CARE_PROVIDER_SITE_OTHER): Payer: 59 | Admitting: Family Medicine

## 2015-12-02 ENCOUNTER — Encounter: Payer: Self-pay | Admitting: Family Medicine

## 2015-12-02 VITALS — BP 121/65 | HR 72 | Temp 98.2°F | Resp 16 | Ht 61.0 in | Wt 146.0 lb

## 2015-12-02 DIAGNOSIS — R05 Cough: Secondary | ICD-10-CM

## 2015-12-02 DIAGNOSIS — R059 Cough, unspecified: Secondary | ICD-10-CM

## 2015-12-02 DIAGNOSIS — Z1329 Encounter for screening for other suspected endocrine disorder: Secondary | ICD-10-CM

## 2015-12-02 DIAGNOSIS — Z136 Encounter for screening for cardiovascular disorders: Secondary | ICD-10-CM | POA: Diagnosis not present

## 2015-12-02 DIAGNOSIS — Z0000 Encounter for general adult medical examination without abnormal findings: Secondary | ICD-10-CM | POA: Diagnosis not present

## 2015-12-02 DIAGNOSIS — R1013 Epigastric pain: Secondary | ICD-10-CM

## 2015-12-02 DIAGNOSIS — Z131 Encounter for screening for diabetes mellitus: Secondary | ICD-10-CM

## 2015-12-02 DIAGNOSIS — Z720 Tobacco use: Secondary | ICD-10-CM | POA: Diagnosis not present

## 2015-12-02 DIAGNOSIS — Z13220 Encounter for screening for lipoid disorders: Secondary | ICD-10-CM | POA: Diagnosis not present

## 2015-12-02 LAB — POCT URINALYSIS DIP (MANUAL ENTRY)
BILIRUBIN UA: NEGATIVE
GLUCOSE UA: NEGATIVE
Ketones, POC UA: NEGATIVE
LEUKOCYTES UA: NEGATIVE
NITRITE UA: NEGATIVE
Protein Ur, POC: NEGATIVE
Spec Grav, UA: 1.015
Urobilinogen, UA: 0.2
pH, UA: 7

## 2015-12-02 LAB — POC MICROSCOPIC URINALYSIS (UMFC): Mucus: ABSENT

## 2015-12-02 NOTE — Patient Instructions (Addendum)
   IF you received an x-ray today, you will receive an invoice from [facility]. Please contact [facility] at [phone] with questions or concerns regarding your invoice.   IF you received labwork today, you will receive an invoice from [facility]. Please contact [facility] at [phone] with questions or concerns regarding your invoice.   Our billing staff will not be able to assist you with questions regarding bills from these companies.  You will be contacted with the lab results as soon as they are available. The fastest way to get your results is to activate your My Chart account. Instructions are located on the last page of this paperwork. If you have not heard from us regarding the results in 2 weeks, please contact this office.     Keeping You Healthy  Get These Tests 1. Blood Pressure- Have your blood pressure checked once a year by your health care provider.  Normal blood pressure is 120/80. 2. Weight- Have your body mass index (BMI) calculated to screen for obesity.  BMI is measure of body fat based on height and weight.  You can also calculate your own BMI at www.nhlbisupport.com/bmi/. 3. Cholesterol- Have your cholesterol checked every 5 years starting at age 20 then yearly starting at age 45. 4. Chlamydia, HIV, and other sexually transmitted diseases- Get screened every year until age 25, then within three months of each new sexual provider. 5. Pap Test - Every 1-5 years; discuss with your health care provider. 6. Mammogram- Every 1-2 years starting at age 40--50  Take these medicines  Calcium with Vitamin D-Your body needs 1200 mg of Calcium each day and 800-1000 IU of Vitamin D daily.  Your body can only absorb 500 mg of Calcium at a time so Calcium must be taken in 2 or 3 divided doses throughout the day.  Multivitamin with folic acid- Once daily if it is possible for you to become pregnant.  Get these  Immunizations  Gardasil-Series of three doses; prevents HPV related illness such as genital warts and cervical cancer.  Menactra-Single dose; prevents meningitis.  Tetanus shot- Every 10 years.  Flu shot-Every year.  Take these steps 1. Do not smoke-Your healthcare provider can help you quit.  For tips on how to quit go to www.smokefree.gov or call 1-800 QUITNOW. 2. Be physically active- Exercise 5 days a week for at least 30 minutes.  If you are not already physically active, start slow and gradually work up to 30 minutes of moderate physical activity.  Examples of moderate activity include walking briskly, dancing, swimming, bicycling, etc. 3. Breast Cancer- A self breast exam every month is important for early detection of breast cancer.  For more information and instruction on self breast exams, ask your healthcare provider or www.womenshealth.gov/faq/breast-self-exam.cfm. 4. Eat a healthy diet- Eat a variety of healthy foods such as fruits, vegetables, whole grains, low fat milk, low fat cheeses, yogurt, lean meats, poultry and fish, beans, nuts, tofu, etc.  For more information go to www. Thenutritionsource.org 5. Drink alcohol in moderation- Limit alcohol intake to one drink or less per day. Never drink and drive. 6. Depression- Your emotional health is as important as your physical health.  If you're feeling down or losing interest in things you normally enjoy please talk to your healthcare provider about being screened for depression. 7. Dental visit- Brush and floss your teeth twice daily; visit your dentist twice a year. 8. Eye doctor- Get an eye exam at least every   2 years. 9. Helmet use- Always wear a helmet when riding a bicycle, motorcycle, rollerblading or skateboarding. 10. Safe sex- If you may be exposed to sexually transmitted infections, use a condom. 11. Seat belts- Seat belts can save your live; always wear one. 12. Smoke/Carbon Monoxide detectors- These detectors need to  be installed on the appropriate level of your home. Replace batteries at least once a year. 13. Skin cancer- When out in the sun please cover up and use sunscreen 15 SPF or higher. 14. Violence- If anyone is threatening or hurting you, please tell your healthcare provider.        

## 2015-12-02 NOTE — Progress Notes (Signed)
Subjective:    Patient ID: Nichole Klein, female    DOB: 27-Oct-1965, 50 y.o.   MRN: [id number]  12/02/2015  Annual Exam   HPI This 50 y.o. female presents for Complete Physical Examination.  Last physical:  11-30-2014 Pap smear:  11-30-2014 Mammogram:  09-05-2013, 12-27-2014 Colonoscopy: none TDAP:  2011 Pneumovax:  2014 Influenza:  2015, [facility]. Eye exam:  None; never; readers Dental exam:  Every six months.    Stress reaction: mother with memory loss; staying with mother Sunday through Thursdays.  Sister is VP of company; has Down's syndrome and requires a lot of attention.    Obesity: joined the corporate challenge at [employer]; lost 35 pounds; [employer] and exercise; running during lunch.  [facility].  Broke foot when went to [place].  Not walking or jogging in three months; needs to start jogging again.     Review of Systems  Constitutional: Negative for fever, chills, diaphoresis, activity change, appetite change, fatigue and unexpected weight change.  HENT: Negative for congestion, dental problem, drooling, ear discharge, ear pain, facial swelling, hearing loss, mouth sores, nosebleeds, postnasal drip, rhinorrhea, sinus pressure, sneezing, sore throat, tinnitus, trouble swallowing and voice change.   Eyes: Negative for photophobia, pain, discharge, redness, itching and visual disturbance.  Respiratory: Negative for apnea, cough, choking, chest tightness, shortness of breath, wheezing and stridor.   Cardiovascular: Negative for chest pain, palpitations and leg swelling.  Gastrointestinal: Negative for nausea, vomiting, abdominal pain, diarrhea, constipation, blood in stool, abdominal distention, anal bleeding and rectal pain.  Endocrine: Negative for cold intolerance, heat intolerance, polydipsia, polyphagia and polyuria.  Genitourinary: Negative for dysuria, urgency, frequency, hematuria, flank pain, decreased urine volume, vaginal bleeding,  vaginal discharge, enuresis, difficulty urinating, genital sores, vaginal pain, menstrual problem, pelvic pain and dyspareunia.  Musculoskeletal: Negative for myalgias, back pain, joint swelling, arthralgias, gait problem, neck pain and neck stiffness.  Skin: Negative for color change, pallor, rash and wound.  Allergic/Immunologic: Negative for environmental allergies, food allergies and immunocompromised state.  Neurological: Negative for dizziness, tremors, seizures, syncope, facial asymmetry, speech difficulty, weakness, light-headedness, numbness and headaches.  Hematological: Negative for adenopathy. Does not bruise/bleed easily.  Psychiatric/Behavioral: Negative for suicidal ideas, hallucinations, behavioral problems, confusion, sleep disturbance, self-injury, dysphoric mood, decreased concentration and agitation. The patient is not nervous/anxious and is not hyperactive.     Past Medical History  Diagnosis Date  . Anxiety   . Arthritis     DDD lumbar  . Herpes labialis   . Hematuria 06/15/2007    s/p urology consultation Assunta GamblesBrian Cope; s/p CT scan negative.  No cystoscopy.   Past Surgical History  Procedure Laterality Date  . Nose surgery    . Spine surgery  06/14/2008    Lumbar surgery L5-S1.  [place].   Not on File Current Outpatient Prescriptions  Medication Sig Dispense Refill  . esomeprazole (NEXIUM) 40 MG capsule Take 40 mg by mouth daily at 12 noon.    Marland Kitchen. ibuprofen (ADVIL,MOTRIN) 200 MG tablet Take 200 mg by mouth every 6 (six) hours as needed. Reported on 12/02/2015    . ranitidine (ZANTAC) 150 MG tablet Take 150 mg by mouth every morning.     No current facility-administered medications for this visit.   Social History   Social History  . Marital Status: Married    Spouse Name: Remi DeterSamuel  . Number of Children: 2  . Years of Education: 15   Occupational History  . CMA     [employer]   Social History Main Topics  . Smoking status: Current Every Day Smoker --  1.50 packs/day for 25 years  . Smokeless tobacco: Never Used  . Alcohol Use: No     Comment: special occasions only.  . Drug Use: No  . Sexual Activity: Yes    Birth Control/ Protection: Surgical     Comment: Husband with vasectomy.   Other Topics Concern  . Not on file   Social History Narrative   Marital status: married x 27 years; happily married; no abuse.      Children: two daughters; three grandchildren.        Employment:  [employer] since 2015      Tobacco:  1-1.25  ppd x 20 years.      Alcohol: two to three drinks per month      Drugs: none      Exercise: daily; videos; floor exercises; situps.  Jogging during lunch in 2017.        Seatbelt:  100%; no texting      Guns:  Loaded; secured.   Family History  Problem Relation Age of Onset  . Diabetes Mother   . Hypertension Mother   . Dementia Mother   . Hyperlipidemia Father   . Cancer Father     prostate  . Stroke Father     after prostate cancer surgery with post-op pneumonia.  . Aortic stenosis Sister   . Hypertension Sister   . Hyperlipidemia Brother   . Hypertension Brother   . Diabetes Brother   . Hyperlipidemia Brother   . Arthritis Brother   . Hypertension Brother   . Mental illness Brother     anxiety  . Stroke Brother     multiple TIAs.       Objective:    BP 121/65 mmHg  Pulse 72  Temp(Src) 98.2 F (36.8 C) (Oral)  Resp 16  Ht  (1.549 m)  Wt 146 lb (66.225 kg)  BMI 27.60 kg/m2 Physical Exam  Constitutional: She is oriented to person, place, and time. She appears well-developed and well-nourished. No distress.  HENT:  Head: Normocephalic and atraumatic.  Right Ear: External ear normal.  Left Ear: External ear normal.  Nose: Nose normal.  Mouth/Throat: Oropharynx is clear and moist.  Eyes: Conjunctivae and EOM are normal. Pupils are equal, round, and reactive to light.  Neck: Normal range of motion and full passive range of motion without pain. Neck supple. No JVD present.  Carotid bruit is not present. No thyromegaly present.  Cardiovascular: Normal rate, regular rhythm and normal heart sounds.  Exam reveals no gallop and no friction rub.   No murmur heard. Pulmonary/Chest: Effort normal and breath sounds normal. She has no wheezes. She has no rales. Right breast exhibits no inverted nipple, no mass, no nipple discharge, no skin change and no tenderness. Left breast exhibits no inverted nipple, no mass, no nipple discharge, no skin change and no tenderness. Breasts are symmetrical.  Abdominal: Soft. Bowel sounds are normal. She exhibits no distension and no mass. There is no tenderness. There is no rebound and no guarding.  Genitourinary: Vagina normal and uterus normal.  Musculoskeletal:       Right shoulder: Normal.       Left shoulder: Normal.       Cervical back: Normal.  Lymphadenopathy:    She has no cervical adenopathy.  Neurological: She is alert and oriented to person, place, and time. She has normal reflexes. No cranial  nerve deficit. She exhibits normal muscle tone. Coordination normal.  Skin: Skin is warm and dry. No rash noted. She is not diaphoretic. No erythema. No pallor.  Psychiatric: She has a normal mood and affect. Her behavior is normal. Judgment and thought content normal.  Nursing note and vitals reviewed.       Assessment & Plan:   1. Routine physical examination   2. Tobacco abuse   3. Cough   4. Screening for thyroid disorder   5. Screening, lipid   6. Screening for diabetes mellitus   7. Screening for cardiovascular condition   8. Abdominal pain, epigastric     Orders Placed This Encounter  Procedures  . DG Chest 2 View    Standing Status: Future     Number of Occurrences: 1     Standing Expiration Date: 12/01/2016    Order Specific Question:  Reason for Exam (SYMPTOM  OR DIAGNOSIS REQUIRED)    Answer:  cough, smoker    Order Specific Question:  Is the patient pregnant?    Answer:  No    Order Specific Question:   Preferred imaging location?    Answer:  External  . CBC with Differential/Platelet  . Comprehensive metabolic panel    Order Specific Question:  Has the patient fasted?    Answer:  Yes  . Hemoglobin A1c  . Lipid panel    Order Specific Question:  Has the patient fasted?    Answer:  Yes  . TSH  . H. pylori antibody, IgG  . POCT urinalysis dipstick  . POCT Microscopic Urinalysis ([facility])  . EKG 12-Lead   Meds ordered this encounter  Medications  . esomeprazole (NEXIUM) 40 MG capsule    Sig: Take 40 mg by mouth daily at 12 noon.    No Follow-up on file.    Kristi Paulita Fujita, M.D. [facility] [address] [place], [place]  [address] [phone] phone [phone] fax

## 2015-12-03 LAB — COMPREHENSIVE METABOLIC PANEL
ALBUMIN: 4.2 g/dL (ref 3.6–5.1)
ALK PHOS: 89 U/L (ref 33–115)
ALT: 8 U/L (ref 6–29)
AST: 13 U/L (ref 10–35)
BILIRUBIN TOTAL: 0.7 mg/dL (ref 0.2–1.2)
BUN: 10 mg/dL (ref 7–25)
CO2: 32 mmol/L — ABNORMAL HIGH (ref 20–31)
CREATININE: 0.75 mg/dL (ref 0.50–1.10)
Calcium: 9.6 mg/dL (ref 8.6–10.2)
Chloride: 103 mmol/L (ref 98–110)
Glucose, Bld: 89 mg/dL (ref 65–99)
Potassium: 4.7 mmol/L (ref 3.5–5.3)
SODIUM: 140 mmol/L (ref 135–146)
TOTAL PROTEIN: 6.8 g/dL (ref 6.1–8.1)

## 2015-12-03 LAB — CBC WITH DIFFERENTIAL/PLATELET
BASOS ABS: 0 {cells}/uL (ref 0–200)
Basophils Relative: 0 %
Eosinophils Absolute: 85 cells/uL (ref 15–500)
Eosinophils Relative: 1 %
HEMATOCRIT: 43.2 % (ref 35.0–45.0)
HEMOGLOBIN: 14.6 g/dL (ref 11.7–15.5)
LYMPHS ABS: 2125 {cells}/uL (ref 850–3900)
Lymphocytes Relative: 25 %
MCH: 31.9 pg (ref 27.0–33.0)
MCHC: 33.8 g/dL (ref 32.0–36.0)
MCV: 94.5 fL (ref 80.0–100.0)
MONO ABS: 765 {cells}/uL (ref 200–950)
MPV: 11.7 fL (ref 7.5–12.5)
Monocytes Relative: 9 %
NEUTROS PCT: 65 %
Neutro Abs: 5525 cells/uL (ref 1500–7800)
Platelets: 250 10*3/uL (ref 140–400)
RBC: 4.57 MIL/uL (ref 3.80–5.10)
RDW: 13 % (ref 11.0–15.0)
WBC: 8.5 10*3/uL (ref 3.8–10.8)

## 2015-12-03 LAB — TSH: TSH: 2.63 m[IU]/L

## 2015-12-03 LAB — LIPID PANEL
Cholesterol: 219 mg/dL — ABNORMAL HIGH (ref 125–200)
HDL: 60 mg/dL (ref >=46)
LDL Cholesterol: 135 mg/dL — ABNORMAL HIGH (ref <130)
Total CHOL/HDL Ratio: 3.7 Ratio (ref <=5.0)
Triglycerides: 122 mg/dL (ref <150)
VLDL: 24 mg/dL (ref <30)

## 2015-12-03 LAB — HEMOGLOBIN A1C
HEMOGLOBIN A1C: 5.4 % (ref <5.7)
MEAN PLASMA GLUCOSE: 108 mg/dL

## 2015-12-29 ENCOUNTER — Encounter: Payer: Self-pay | Admitting: Family Medicine

## 2016-02-24 ENCOUNTER — Other Ambulatory Visit: Payer: Self-pay | Admitting: Family Medicine

## 2016-02-24 DIAGNOSIS — Z1231 Encounter for screening mammogram for malignant neoplasm of breast: Secondary | ICD-10-CM

## 2016-03-03 ENCOUNTER — Ambulatory Visit
Admission: RE | Admit: 2016-03-03 | Discharge: 2016-03-03 | Disposition: A | Discharge status: Home / Self Care | Payer: 59 | Source: Ambulatory Visit | Attending: Family Medicine | Admitting: Family Medicine

## 2016-03-03 ENCOUNTER — Other Ambulatory Visit: Payer: Self-pay | Admitting: Family Medicine

## 2016-03-03 DIAGNOSIS — Z1231 Encounter for screening mammogram for malignant neoplasm of breast: Secondary | ICD-10-CM | POA: Diagnosis not present

## 2016-03-04 ENCOUNTER — Other Ambulatory Visit: Payer: Self-pay | Admitting: Family Medicine

## 2016-03-04 DIAGNOSIS — N6320 Unspecified lump in the left breast, unspecified quadrant: Secondary | ICD-10-CM

## 2016-03-10 ENCOUNTER — Ambulatory Visit (INDEPENDENT_AMBULATORY_CARE_PROVIDER_SITE_OTHER): Payer: 59 | Admitting: Family Medicine

## 2016-03-10 ENCOUNTER — Telehealth: Payer: Self-pay

## 2016-03-10 VITALS — BP 118/74 | HR 90 | Temp 98.3°F | Resp 16 | Ht 62.0 in | Wt 150.0 lb

## 2016-03-10 DIAGNOSIS — R928 Other abnormal and inconclusive findings on diagnostic imaging of breast: Secondary | ICD-10-CM | POA: Diagnosis not present

## 2016-03-10 DIAGNOSIS — F419 Anxiety disorder, unspecified: Secondary | ICD-10-CM

## 2016-03-10 DIAGNOSIS — R457 State of emotional shock and stress, unspecified: Secondary | ICD-10-CM

## 2016-03-10 DIAGNOSIS — F329 Major depressive disorder, single episode, unspecified: Secondary | ICD-10-CM

## 2016-03-10 DIAGNOSIS — F32A Depression, unspecified: Secondary | ICD-10-CM

## 2016-03-10 DIAGNOSIS — F4389 Other reactions to severe stress: Secondary | ICD-10-CM

## 2016-03-10 MED ORDER — ALPRAZOLAM 0.5 MG PO TABS: 0.5000 mg | ORAL_TABLET | Freq: Every evening | ORAL | 1 refills | Status: DC | PRN

## 2016-03-10 MED ORDER — SERTRALINE HCL 25 MG PO TABS: 25.0000 mg | ORAL_TABLET | Freq: Every day | ORAL | 2 refills | Status: DC

## 2016-03-10 NOTE — Telephone Encounter (Signed)
Patient needs FMLA forms completed by Dr Katrinka BlazingSmith, She has an appointment with Dr Katrinka BlazingSmith on 03/10/16 I will place the forms in your box on 03/10/16 also if you could get them completed and place them back in my box within 5-7 business days. Thank you

## 2016-03-10 NOTE — Progress Notes (Signed)
Subjective:    Patient ID: Nichole CrazeRamona Kay Klein, female    DOB: 07-22-1965, [age] y.o.   MRN: [id number]  03/10/2016  Depression (depression# 11)   HPI This 50 y.o. female presents for evaluation of increasing anxiety and depression.  Caring for mother with dementia and really struggling emotionally.  Sister and brother help out minimally during the week which is frustrating for patient.  Really struggling having to care for mother as if she is a child.  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  Has good support from husband.  Feels that she needs medication assistance.  She has joined a support group for family members who are caring for patients with dementia.  Staying at [employer] house each night.  Really misses husband.  Works and cares for her mother mostly.  Denies SI/HI.  Requesting a few days off of work for rest.  S/p mammogram last week with abnormality; needs additional imaging.   Review of Systems  Constitutional: Negative for chills, diaphoresis, fatigue and fever.  Eyes: Negative for visual disturbance.  Respiratory: Negative for cough and shortness of breath.   Cardiovascular: Negative for chest pain, palpitations and leg swelling.  Gastrointestinal: Negative for abdominal pain, constipation, diarrhea, nausea and vomiting.  Endocrine: Negative for cold intolerance, heat intolerance, polydipsia, polyphagia and polyuria.  Neurological: Negative for dizziness, tremors, seizures, syncope, facial asymmetry, speech difficulty, weakness, light-headedness, numbness and headaches.  Psychiatric/Behavioral: Positive for dysphoric mood and sleep disturbance. Negative for self-injury and suicidal ideas. The patient is nervous/anxious.     Past Medical History:  Diagnosis Date  . Anxiety   . Arthritis      DDD lumbar  . Hematuria 06/15/2007   s/p urology consultation Assunta GamblesBrian Cope; s/p CT scan negative.  No cystoscopy.  Marland Kitchen. Herpes labialis    Past Surgical History:  Procedure Laterality Date  . NOSE SURGERY    . SPINE SURGERY  06/14/2008   Lumbar surgery L5-S1.  [place].   No Known Allergies Current Outpatient Prescriptions  Medication Sig Dispense Refill  . esomeprazole (NEXIUM) 40 MG capsule Take 40 mg by mouth daily at 12 noon.    Marland Kitchen. ibuprofen (ADVIL,MOTRIN) 200 MG tablet Take 200 mg by mouth every 6 (six) hours as needed. Reported on 12/02/2015    . ALPRAZolam (XANAX) 0.5 MG tablet Take 1 tablet (0.5 mg total) by mouth at bedtime as needed for anxiety. 30 tablet 1  . sertraline (ZOLOFT) 25 MG tablet Take 1 tablet (25 mg total) by mouth at bedtime. 30 tablet 2   No current facility-administered medications for this visit.    Social History   Social History  . Marital status: Married    Spouse name: Remi DeterSamuel  . Number of children: 2  . Years of education: 15   Occupational History  . 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 Smoking status: Current Every Day Smoker    Packs/day: 1.50    Years: 25.00  . Smokeless tobacco: Never Used  . Alcohol use No     Comment: special occasions only.  . Drug use: No  . Sexual activity: Yes    Birth control/ protection: Surgical     Comment: Husband with vasectomy.   Other Topics Concern  . Not  on file   Social History Narrative   Marital status: married x 27 years; happily married; no abuse.      Children: two daughters; three grandchildren.        Employment:  [employer] since 2015      Tobacco:  1-1.25  ppd x 20 years.      Alcohol: two to three drinks per month      Drugs: none      Exercise: daily; videos; floor exercises; situps.  Jogging during lunch in 2017.        Seatbelt:  100%; no texting      Guns:  Loaded; secured.   Family History  Problem Relation Age of Onset  .  Diabetes Mother   . Hypertension Mother   . Dementia Mother   . Hyperlipidemia Father   . Cancer Father     prostate  . Stroke Father     after prostate cancer surgery with post-op pneumonia.  . Aortic stenosis Sister   . Hypertension Sister   . Hyperlipidemia Brother   . Hypertension Brother   . Diabetes Brother   . Hyperlipidemia Brother   . Arthritis Brother   . Hypertension Brother   . Mental illness Brother     anxiety  . Stroke Brother     multiple TIAs.  . Breast cancer Neg Hx        Objective:    BP 118/74 (BP Location: Right Arm, Patient Position: Sitting, Cuff Size: Normal)   Pulse 90   Temp 98.3 F (36.8 C) (Oral)   Resp 16   Ht 5\' 2"  (1.575 m)   Wt 150 lb (68 kg)   LMP 08/17/2013   SpO2 98%   BMI 27.44 kg/m  Physical Exam  Constitutional: She is oriented to person, place, and time. She appears well-developed and well-nourished. No distress.  HENT:  Head: Normocephalic and atraumatic.  Right Ear: External ear normal.  Left Ear: External ear normal.  Nose: Nose normal.  Mouth/Throat: Oropharynx is clear and moist.  Eyes: Conjunctivae and EOM are normal. Pupils are equal, round, and reactive to light.  Neck: Normal range of motion. Neck supple. Carotid bruit is not present. No thyromegaly present.  Cardiovascular: Normal rate, regular rhythm, normal heart sounds and intact distal pulses.  Exam reveals no gallop and no friction rub.   No murmur heard. Pulmonary/Chest: Effort normal and breath sounds normal. She has no wheezes. She has no rales.  Abdominal: Soft. Bowel sounds are normal. She exhibits no distension and no mass. There is no tenderness. There is no rebound and no guarding.  Lymphadenopathy:    She has no cervical adenopathy.  Neurological: She is alert and oriented to person, place, and time. No cranial nerve deficit. She exhibits normal muscle tone. Coordination normal.  Tearful; anxious; angry.  Skin: Skin is warm and dry. No rash noted.  She is not diaphoretic. No erythema. No pallor.  Psychiatric: She has a normal mood and affect. Her behavior is normal.        Assessment & Plan:   1. Anxiety and depression   2. Caregiver stress syndrome   3. Abnormal mammogram    -New; counseling provided during visit. -rx for Zoloft 25mg  daily provided; rx for Xanax also provided for PRN use. -discussed asking siblings for more help.  -agreeable to completing FMLA paperwork. -schedule additional mammogram studies and breast US.   No orders of the defined types were placed in this encounter.  Meds  ordered this encounter  Medications  . sertraline (ZOLOFT) 25 MG tablet    Sig: Take 1 tablet (25 mg total) by mouth at bedtime.    Dispense:  30 tablet    Refill:  2  . ALPRAZolam (XANAX) 0.5 MG tablet    Sig: Take 1 tablet (0.5 mg total) by mouth at bedtime as needed for anxiety.    Dispense:  30 tablet    Refill:  1    No Follow-up on file.   Kaylana Fenstermacher Paulita Fujita, M.D. [facility] [address] [place], [place]  [address] [phone] phone [phone] fax

## 2016-03-15 ENCOUNTER — Telehealth: Payer: Self-pay

## 2016-03-15 NOTE — Telephone Encounter (Signed)
I see 2 orders in the chart. Please sign and advise when ready to schedule.

## 2016-03-15 NOTE — Telephone Encounter (Signed)
Patient is waiting for Dr. Katrinka BlazingSmith to sign [facility]. Patient spoke with Dr. Katrinka BlazingSmith on Thursday and it was stated the orders will be signed. Patient stated everything is at a stand still until orders are signed.     [phone].

## 2016-03-16 NOTE — Telephone Encounter (Signed)
Order signed on 03/15/16.  [facility] that [facility] signed order.

## 2016-03-17 NOTE — Telephone Encounter (Signed)
Left message advised order is signed and she may call for her appointment.

## 2016-03-18 NOTE — Telephone Encounter (Signed)
Have we completed these forms yet today is the 8th day and they are due on the 10th.

## 2016-03-19 DIAGNOSIS — Z0271 Encounter for disability determination: Secondary | ICD-10-CM

## 2016-03-19 NOTE — Telephone Encounter (Signed)
Forms completed on 03/18/16 and handed to [place] for processing.

## 2016-03-19 NOTE — Telephone Encounter (Signed)
paperwork scanned and faxed to [employer] on 03/19/16

## 2016-03-26 ENCOUNTER — Ambulatory Visit
Admission: RE | Admit: 2016-03-26 | Discharge: 2016-03-26 | Disposition: A | Discharge status: Home / Self Care | Payer: 59 | Source: Ambulatory Visit | Attending: Family Medicine | Admitting: Family Medicine

## 2016-03-26 DIAGNOSIS — N6320 Unspecified lump in the left breast, unspecified quadrant: Secondary | ICD-10-CM | POA: Diagnosis not present

## 2016-03-26 DIAGNOSIS — N6489 Other specified disorders of breast: Secondary | ICD-10-CM | POA: Diagnosis not present

## 2016-03-26 DIAGNOSIS — R928 Other abnormal and inconclusive findings on diagnostic imaging of breast: Secondary | ICD-10-CM | POA: Diagnosis not present

## 2016-04-04 DIAGNOSIS — F329 Major depressive disorder, single episode, unspecified: Secondary | ICD-10-CM | POA: Insufficient documentation

## 2016-04-04 DIAGNOSIS — F32A Depression, unspecified: Secondary | ICD-10-CM | POA: Insufficient documentation

## 2016-04-04 DIAGNOSIS — F419 Anxiety disorder, unspecified: Principal | ICD-10-CM

## 2016-04-04 DIAGNOSIS — R457 State of emotional shock and stress, unspecified: Secondary | ICD-10-CM | POA: Insufficient documentation

## 2016-04-13 DIAGNOSIS — J189 Pneumonia, unspecified organism: Secondary | ICD-10-CM | POA: Diagnosis not present

## 2016-05-18 ENCOUNTER — Encounter: Payer: Self-pay | Admitting: Family Medicine

## 2016-05-18 ENCOUNTER — Ambulatory Visit (INDEPENDENT_AMBULATORY_CARE_PROVIDER_SITE_OTHER): Payer: 59 | Admitting: Family Medicine

## 2016-05-18 VITALS — BP 140/70 | HR 79 | Temp 98.7°F | Ht 62.0 in | Wt 149.0 lb

## 2016-05-18 DIAGNOSIS — Z720 Tobacco use: Secondary | ICD-10-CM | POA: Diagnosis not present

## 2016-05-18 DIAGNOSIS — R457 State of emotional shock and stress, unspecified: Secondary | ICD-10-CM

## 2016-05-18 DIAGNOSIS — K219 Gastro-esophageal reflux disease without esophagitis: Secondary | ICD-10-CM

## 2016-05-18 DIAGNOSIS — J9801 Acute bronchospasm: Secondary | ICD-10-CM

## 2016-05-18 DIAGNOSIS — F419 Anxiety disorder, unspecified: Secondary | ICD-10-CM

## 2016-05-18 MED ORDER — ALPRAZOLAM 0.5 MG PO TABS: 0.5000 mg | ORAL_TABLET | Freq: Every evening | ORAL | 1 refills | Status: DC | PRN

## 2016-05-18 NOTE — Patient Instructions (Signed)
     IF you received an x-ray today, you will receive an invoice from [facility]. Please contact [facility] at [phone] with questions or concerns regarding your invoice.   IF you received labwork today, you will receive an invoice from [facility]. Please contact [facility] at [phone] with questions or concerns regarding your invoice.   Our billing staff will not be able to assist you with questions regarding bills from these companies.  You will be contacted with the lab results as soon as they are available. The fastest way to get your results is to activate your My Chart account. Instructions are located on the last page of this paperwork. If you have not heard from us regarding the results in 2 weeks, please contact this office.      

## 2016-05-18 NOTE — Progress Notes (Signed)
Subjective:    Patient ID: Nichole Klein, female    DOB: May 14, 1966, 50 y.o.   MRN: [id number]  05/18/2016  medication check (recently stopped taking Zoloft; doing well on other medication)   HPI This 50 y.o. female presents for three month follow-up for anxiety/depression.  Sammy is staying with patient at pt's mom's house every night.  Sammy is cooking dinner every night.  Mother is declining.  Going to move mother in with patient in January 2018.  Will attend Adult Daycare in January.  Stopped worrying about what siblings were going to do; decided everything is on pt.  Cannot make them do anything.  Tripping out in October caused pt to set limits.  Stopped Zoloft; thre first week noticed a lot.  Might have brought out of anger.  Second week felt like taking Prozac again; got really agitated.  Cannot tolerate any of them; makes agitated.  Taking Xanax; showing Xanax intake; has not refilled medication.  Must take one tablet daily now.  Cannot tolerate 1/2; has four remaining Xanax. On a bad day, takes two at bedtime.  Did talk with Alzheimer's support group; now meeting sporadically. Did go and talk to people [place].  Adult Day Care is through [facility].  Sunday is day off; brother stays with mother. Mother is excited about living with patient.   Missed one day last month.    Mother is allergic to smoke.  Plans to quit 06/14/16.   Review of Systems  Constitutional: Negative for chills, diaphoresis, fatigue and fever.  Eyes: Negative for visual disturbance.  Respiratory: Positive for cough and wheezing. Negative for shortness of breath.   Cardiovascular: Negative for chest pain, palpitations and leg swelling.  Gastrointestinal: Negative for abdominal pain, constipation, diarrhea, nausea and vomiting.  Endocrine: Negative for cold intolerance, heat intolerance, polydipsia, polyphagia and polyuria.  Neurological: Negative for dizziness, tremors, seizures, syncope, facial  asymmetry, speech difficulty, weakness, light-headedness, numbness and headaches.  Psychiatric/Behavioral: Positive for dysphoric mood. Negative for self-injury, sleep disturbance and suicidal ideas. The patient is nervous/anxious.     Past Medical History:  Diagnosis Date  . Anxiety   . Arthritis    DDD lumbar  . Hematuria 06/15/2007   s/p urology consultation Edrick Oh; s/p CT scan negative.  No cystoscopy.  Marland Kitchen Herpes labialis    Past Surgical History:  Procedure Laterality Date  . NOSE SURGERY    . SPINE SURGERY  06/14/2008   Lumbar surgery L5-S1.  [place].   No Known Allergies Current Outpatient Prescriptions  Medication Sig Dispense Refill  . ALPRAZolam (XANAX) 0.5 MG tablet Take 1 tablet (0.5 mg total) by mouth at bedtime as needed for anxiety. 30 tablet 1  . ibuprofen (ADVIL,MOTRIN) 200 MG tablet Take 200 mg by mouth every 6 (six) hours as needed. Reported on 12/02/2015    . ranitidine (ZANTAC) 150 MG tablet Take 150 mg by mouth 2 (two) times daily.    Marland Kitchen esomeprazole (NEXIUM) 40 MG capsule Take 40 mg by mouth daily at 12 noon.     No current facility-administered medications for this visit.    Social History   Social History  . Marital status: Married    Spouse name: Mikeal Hawthorne  . Number of children: 2  . Years of education: 15   Occupational History  . 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 Smoking status: Current Every Day Smoker    Packs/day: 1.50  Years: 25.00  . Smokeless tobacco: Never Used  . Alcohol use No     Comment: special occasions only.  . Drug use: No  . Sexual activity: Yes    Birth control/ protection: Surgical     Comment: Husband with vasectomy.   Other Topics Concern  . Not on file   Social History Narrative   Marital status: married x 27 years; happily married; no abuse.      Children: two daughters; three grandchildren.        Employment:  [employer] since 2015      Tobacco:  1-1.25   ppd x 20 years.      Alcohol: two to three drinks per month      Drugs: none      Exercise: daily; videos; floor exercises; situps.  Jogging during lunch in 2017.        Seatbelt:  100%; no texting      Guns:  Loaded; secured.   Family History  Problem Relation Age of Onset  . Diabetes Mother   . Hypertension Mother   . Dementia Mother   . Hyperlipidemia Father   . Cancer Father     prostate  . Stroke Father     after prostate cancer surgery with post-op pneumonia.  . Aortic stenosis Sister   . Hypertension Sister   . Hyperlipidemia Brother   . Hypertension Brother   . Diabetes Brother   . Hyperlipidemia Brother   . Arthritis Brother   . Hypertension Brother   . Mental illness Brother     anxiety  . Stroke Brother     multiple TIAs.  . Breast cancer Neg Hx        Objective:    BP 140/70 (BP Location: Left Arm, Patient Position: Sitting, Cuff Size: Normal)   Pulse 79   Temp 98.7 F (37.1 C) (Oral)   Ht 5' 2"  (1.575 m)   Wt 149 lb (67.6 kg) Comment: had weight checked this morning at [facility]  LMP 08/17/2013   BMI 27.25 kg/m  Physical Exam  Constitutional: She is oriented to person, place, and time. She appears well-developed and well-nourished. No distress.  HENT:  Head: Normocephalic and atraumatic.  Right Ear: External ear normal.  Left Ear: External ear normal.  Nose: Nose normal.  Mouth/Throat: Oropharynx is clear and moist.  Eyes: Conjunctivae and EOM are normal. Pupils are equal, round, and reactive to light.  Neck: Normal range of motion. Neck supple. Carotid bruit is not present. No thyromegaly present.  Cardiovascular: Normal rate, regular rhythm, normal heart sounds and intact distal pulses.  Exam reveals no gallop and no friction rub.   No murmur heard. Pulmonary/Chest: Effort normal. She has wheezes. She has no rales.  Abdominal: Soft. Bowel sounds are normal. She exhibits no distension and no mass. There is no tenderness. There is no  rebound and no guarding.  Lymphadenopathy:    She has no cervical adenopathy.  Neurological: She is alert and oriented to person, place, and time. No cranial nerve deficit.  Skin: Skin is warm and dry. No rash noted. She is not diaphoretic. No erythema. No pallor.  Psychiatric: She has a normal mood and affect. Her behavior is normal.   Results for orders placed or performed in visit on 12/02/15  CBC with Differential/Platelet  Result Value Ref Range   WBC 8.5 3.8 - 10.8 K/uL   RBC 4.57 3.80 - 5.10 MIL/uL   Hemoglobin 14.6 11.7 - 15.5 g/dL  HCT 43.2 35.0 - 45.0 %   MCV 94.5 80.0 - 100.0 fL   MCH 31.9 27.0 - 33.0 pg   MCHC 33.8 32.0 - 36.0 g/dL   RDW 13.0 11.0 - 15.0 %   Platelets 250 140 - 400 K/uL   MPV 11.7 7.5 - 12.5 fL   Neutro Abs 5,525 1,500 - 7,800 cells/uL   Lymphs Abs 2,125 850 - 3,900 cells/uL   Monocytes Absolute 765 200 - 950 cells/uL   Eosinophils Absolute 85 15 - 500 cells/uL   Basophils Absolute 0 0 - 200 cells/uL   Neutrophils Relative % 65 %   Lymphocytes Relative 25 %   Monocytes Relative 9 %   Eosinophils Relative 1 %   Basophils Relative 0 %   Smear Review Criteria for review not met   Comprehensive metabolic panel  Result Value Ref Range   Sodium 140 135 - 146 mmol/L   Potassium 4.7 3.5 - 5.3 mmol/L   Chloride 103 98 - 110 mmol/L   CO2 32 (H) 20 - 31 mmol/L   Glucose, Bld 89 65 - 99 mg/dL   BUN 10 7 - 25 mg/dL   Creat 0.75 0.50 - 1.10 mg/dL   Total Bilirubin 0.7 0.2 - 1.2 mg/dL   Alkaline Phosphatase 89 33 - 115 U/L   AST 13 10 - 35 U/L   ALT 8 6 - 29 U/L   Total Protein 6.8 6.1 - 8.1 g/dL   Albumin 4.2 3.6 - 5.1 g/dL   Calcium 9.6 8.6 - 10.2 mg/dL  Hemoglobin A1c  Result Value Ref Range   Hgb A1c MFr Bld 5.4 <5.7 %   Mean Plasma Glucose 108 mg/dL  Lipid panel  Result Value Ref Range   Cholesterol 219 (H) 125 - 200 mg/dL   Triglycerides 122 <150 mg/dL   HDL 60 >=46 mg/dL   Total CHOL/HDL Ratio 3.7 <=5.0 Ratio   VLDL 24 <30 mg/dL   LDL  Cholesterol 135 (H) <130 mg/dL  TSH  Result Value Ref Range   TSH 2.63 mIU/L  POCT urinalysis dipstick  Result Value Ref Range   Color, UA yellow yellow   Clarity, UA clear clear   Glucose, UA negative negative   Bilirubin, UA negative negative   Ketones, POC UA negative negative   Spec Grav, UA 1.015    Blood, UA trace-intact (A) negative   pH, UA 7.0    Protein Ur, POC negative negative   Urobilinogen, UA 0.2    Nitrite, UA Negative Negative   Leukocytes, UA Negative Negative  POCT Microscopic Urinalysis ([facility])  Result Value Ref Range   WBC,UR,HPF,POC None None WBC/hpf   RBC,UR,HPF,POC None None RBC/hpf   Bacteria None None, Too numerous to count   Mucus Absent Absent   Epithelial Cells, UR Per Microscopy None None, Too numerous to count cells/hpf       Assessment & Plan:   1. Anxiety   2. Tobacco abuse   3. Bronchospasm   4. Caregiver stress syndrome   5. Gastroesophageal reflux disease without esophagitis    -improved from last visit; intolerant to SSRI again. -continue with sparing use of Xanax; continue with group counseling sessions. -excellent family support from husband. -mother to move in with pt in January 2018. -wheezing on exam today; highly encourage smoking cessation; perform spirometry at next visit.   No orders of the defined types were placed in this encounter.  Meds ordered this encounter  Medications  . ranitidine (ZANTAC) 150 MG  tablet    Sig: Take 150 mg by mouth 2 (two) times daily.  Marland Kitchen ALPRAZolam (XANAX) 0.5 MG tablet    Sig: Take 1 tablet (0.5 mg total) by mouth at bedtime as needed for anxiety.    Dispense:  30 tablet    Refill:  1    Return in about 3 months (around 08/16/2016) for recheck .   Kristi Elayne Guerin, M.D. [facility] [address] [place], [place]  [address] [phone] phone [phone] fax

## 2016-05-31 DIAGNOSIS — K219 Gastro-esophageal reflux disease without esophagitis: Secondary | ICD-10-CM | POA: Insufficient documentation

## 2016-07-06 ENCOUNTER — Ambulatory Visit (INDEPENDENT_AMBULATORY_CARE_PROVIDER_SITE_OTHER): Payer: 59

## 2016-07-06 ENCOUNTER — Ambulatory Visit
Admission: EM | Admit: 2016-07-06 | Discharge: 2016-07-06 | Disposition: A | Discharge status: Home / Self Care | Payer: 59 | Attending: Family Medicine | Admitting: Family Medicine

## 2016-07-06 DIAGNOSIS — S322XXA Fracture of coccyx, initial encounter for closed fracture: Secondary | ICD-10-CM

## 2016-07-06 DIAGNOSIS — S3219XA Other fracture of sacrum, initial encounter for closed fracture: Secondary | ICD-10-CM | POA: Diagnosis not present

## 2016-07-06 NOTE — ED Triage Notes (Addendum)
Pt fell down her deck on Wednesday on landed on her tailbone, she is unable to sit and it is very painful. Cant bend down or sit straight down, walking in painful. Standing causes a pressure sensitive.

## 2016-07-06 NOTE — ED Provider Notes (Signed)
CSN: [id number]     Arrival date & time 07/06/16  0804 History   First MD Initiated Contact with Patient 07/06/16 0831     Chief Complaint  Patient presents with  . Tailbone Pain   (Consider location/radiation/quality/duration/timing/severity/associated sxs/prior Treatment) HPI  This a 51 year old female 6 days ago was attempting to have her dog come in out of the snow when she slipped and fell on the deck. She landed directly on her buttocks and is now finding it very painful to sit she states that she is unable to bend forward or sit down. Walking is also painful.      Past Medical History:  Diagnosis Date  . Anxiety   . Arthritis    DDD lumbar  . Hematuria 06/15/2007   s/p urology consultation Assunta GamblesBrian Cope; s/p CT scan negative.  No cystoscopy.  Marland Kitchen. Herpes labialis    Past Surgical History:  Procedure Laterality Date  . NOSE SURGERY    . SPINE SURGERY  06/14/2008   Lumbar surgery L5-S1.  [place].   Family History  Problem Relation Age of Onset  . Diabetes Mother   . Hypertension Mother   . Dementia Mother   . Hyperlipidemia Father   . Cancer Father     prostate  . Stroke Father     after prostate cancer surgery with post-op pneumonia.  . Aortic stenosis Sister   . Hypertension Sister   . Hyperlipidemia Brother   . Hypertension Brother   . Diabetes Brother   . Hyperlipidemia Brother   . Arthritis Brother   . Hypertension Brother   . Mental illness Brother     anxiety  . Stroke Brother     multiple TIAs.  . Breast cancer Neg Hx    Social History  Substance Use Topics  . Smoking status: Current Every Day Smoker    Packs/day: 1.50    Years: 25.00  . Smokeless tobacco: Never Used  . Alcohol use No     Comment: special occasions only.   OB History    No data available     Review of Systems  Constitutional: Positive for activity change. Negative for chills, fatigue and fever.  Musculoskeletal: Positive for back pain.  All other systems reviewed and are  negative.   Allergies  Patient has no known allergies.  Home Medications   Prior to Admission medications   Medication Sig Start Date End Date Taking? Authorizing Provider  ALPRAZolam Prudy Feeler(XANAX) 0.5 MG tablet Take 1 tablet (0.5 mg total) by mouth at bedtime as needed for anxiety. 05/18/16  Yes Ethelda ChickKristi M Smith, MD  ibuprofen (ADVIL,MOTRIN) 200 MG tablet Take 200 mg by mouth every 6 (six) hours as needed. Reported on 12/02/2015   Yes Historical Provider, MD  ranitidine (ZANTAC) 150 MG tablet Take 150 mg by mouth 2 (two) times daily.   Yes Historical Provider, MD   Meds Ordered and Administered this Visit  Medications - No data to display  BP 127/71 (BP Location: Right Arm)   Pulse 98   Temp 98 F (36.7 C) (Oral)   Resp 18   Ht 5\' 1"  (1.549 m)   Wt 154 lb (69.9 kg)   LMP 08/17/2013 Comment: denies preg  SpO2 98%   BMI 29.10 kg/m  No data found.   Physical Exam  Constitutional: She appears well-developed and well-nourished. No distress.  HENT:  Head: Normocephalic and atraumatic.  Eyes: EOM are normal. Pupils are equal, round, and reactive to light. Right eye exhibits no  discharge. Left eye exhibits no discharge.  Neck: Normal range of motion. Neck supple.  Musculoskeletal: She exhibits tenderness. She exhibits no deformity.  Examination was performed with Morrie Sheldon, student nurse as chaperone. Patient has very limited for range of motion. She is also tender with extension as well as lateral flexion. Maximum tenderness is sharply localized over the sacrum deep in the buttock crease. Reproduces her pain.  Lymphadenopathy:    She has no cervical adenopathy.  Skin: She is not diaphoretic.  Nursing note and vitals reviewed.   Urgent Care Course     Procedures (including critical care time)  Labs Review Labs Reviewed - No data to display  Imaging Review Dg Sacrum/coccyx  Result Date: 07/06/2016 CLINICAL DATA:  Tail bone pain after fall 6 days ago. EXAM: SACRUM AND COCCYX - 2+  VIEW COMPARISON:  CT scan of December 31, 2013. FINDINGS: Mildly displaced fracture is seen involving the distal sacrum. Sacroiliac and hip joints appear normal. IMPRESSION: Mildly displaced distal sacral fracture. Electronically Signed   By: Lupita Raider, M.D.   On: 07/06/2016 09:07     Visual Acuity Review  Right Eye Distance:   Left Eye Distance:   Bilateral Distance:    Right Eye Near:   Left Eye Near:    Bilateral Near:         MDM   1. Closed fracture of coccyx, initial encounter [facility])    New Prescriptions   No medications on file  Plan: 1. Test/x-ray results and diagnosis reviewed with patient 2. rx as per orders; risks, benefits, potential side effects reviewed with patient 3. Recommend supportive treatment with  doughnut or wedge pillow to help protect from further trauma. She has elected to use over-the-counter ibuprofen for pain relief. Ice or heat may be beneficial for pain relief. As this may be a long-term healing process upwards of 2-4 months. She may require iinjections in the future or possibly surgical removal. If she continues to have pain she should follow-up with her primary care or with an orthopedic surgeon 4. F/u prn if symptoms worsen or don't improve     Lutricia Feil, PA-C 07/06/16 [phone]

## 2016-08-18 ENCOUNTER — Ambulatory Visit (INDEPENDENT_AMBULATORY_CARE_PROVIDER_SITE_OTHER): Payer: 59 | Admitting: Family Medicine

## 2016-08-18 ENCOUNTER — Encounter: Payer: Self-pay | Admitting: Family Medicine

## 2016-08-18 VITALS — BP 122/74 | HR 77 | Temp 98.9°F | Resp 18 | Ht 61.0 in | Wt 149.7 lb

## 2016-08-18 DIAGNOSIS — K219 Gastro-esophageal reflux disease without esophagitis: Secondary | ICD-10-CM

## 2016-08-18 DIAGNOSIS — Z720 Tobacco use: Secondary | ICD-10-CM | POA: Diagnosis not present

## 2016-08-18 DIAGNOSIS — F419 Anxiety disorder, unspecified: Secondary | ICD-10-CM | POA: Diagnosis not present

## 2016-08-18 DIAGNOSIS — J9801 Acute bronchospasm: Secondary | ICD-10-CM

## 2016-08-18 DIAGNOSIS — R457 State of emotional shock and stress, unspecified: Secondary | ICD-10-CM | POA: Diagnosis not present

## 2016-08-18 MED ORDER — ALPRAZOLAM 0.5 MG PO TABS: 0.5000 mg | ORAL_TABLET | Freq: Every evening | ORAL | 1 refills | Status: DC | PRN

## 2016-08-18 NOTE — Progress Notes (Signed)
Subjective:    Patient ID: Nichole Klein, female    DOB: [birth year], 51 y.o.   MRN: [id number]  08/18/2016  Follow-up (anxiety )   HPI This 51 y.o. female presents for evaluation of anxiety/stress reaction.  Mother moved in on 07/15/15.  Now work all week.  Siblings now not helping. Sister not helping at all.  Cleared out mother's house.  Can't burn candles.  Mother with horrible allergies. Dog is peeing on mother's trash can.  Angry; slacking off on exercise.  Smoking out the window.  Smoking more.  Gets so aggravated.  Stopped in November and cleaned house out.  No smoking inside snce November 2017. No SI/HI. Admits to anger frequently; starting to exercise for stress management.  Excellent support from husband.  Intolerant to several SSRIs in the past; rarely taking Xanax at all.   Has not been able to decrease smoking intake.  Continues to have smoker's cough and wheezing intermittently.  Immunization History  Administered Date(s) Administered  . Influenza Split 05/15/2012, 04/07/2013  . Influenza-Unspecified 06/11/2014, 03/03/2016  . Pneumococcal Polysaccharide-23 07/10/2012  . Tdap 06/14/2009   BP Readings from Last 3 Encounters:  08/18/16 122/74  07/06/16 127/71  05/18/16 140/70   Wt Readings from Last 3 Encounters:  08/18/16 149 lb 11.2 oz (67.9 kg)  07/06/16 154 lb (69.9 kg)  05/18/16 149 lb (67.6 kg)    Review of Systems  Constitutional: Negative for chills, diaphoresis, fatigue and fever.  Eyes: Negative for visual disturbance.  Respiratory: Positive for cough and wheezing. Negative for shortness of breath.   Cardiovascular: Negative for chest pain, palpitations and leg swelling.  Gastrointestinal: Negative for abdominal pain, constipation, diarrhea, nausea and vomiting.  Endocrine: Negative for cold intolerance, heat intolerance, polydipsia, polyphagia and polyuria.  Neurological: Negative for dizziness, tremors, seizures, syncope, facial asymmetry, speech  difficulty, weakness, light-headedness, numbness and headaches.  Psychiatric/Behavioral: Negative for dysphoric mood, self-injury and sleep disturbance. The patient is nervous/anxious.     Past Medical History:  Diagnosis Date  . Anxiety   . Arthritis    DDD lumbar  . Hematuria 06/15/2007   s/p urology consultation Assunta GamblesBrian Cope; s/p CT scan negative.  No cystoscopy.  Marland Kitchen. Herpes labialis    Past Surgical History:  Procedure Laterality Date  . NOSE SURGERY    . SPINE SURGERY  06/14/2008   Lumbar surgery L5-S1.  [place].   No Known Allergies Current Outpatient Prescriptions  Medication Sig Dispense Refill  . ALPRAZolam (XANAX) 0.5 MG tablet Take 1 tablet (0.5 mg total) by mouth at bedtime as needed for anxiety. 30 tablet 1  . ibuprofen (ADVIL,MOTRIN) 200 MG tablet Take 200 mg by mouth every 6 (six) hours as needed. Reported on 12/02/2015    . ranitidine (ZANTAC) 150 MG tablet Take 150 mg by mouth 2 (two) times daily.     No current facility-administered medications for this visit.    Social History   Social History  . Marital status: Married    Spouse name: Remi DeterSamuel  . Number of children: 2  . Years of education: 15   Occupational History  . 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 Smoking status: Current Every Day Smoker    Packs/day: 1.50    Years: 25.00  . Smokeless tobacco: Never Used  . Alcohol use No     Comment: special occasions only.  . Drug use: No  . Sexual activity: Yes  Birth control/ protection: Surgical     Comment: Husband with vasectomy.   Other Topics Concern  . Not on file   Social History Narrative   Marital status: married x 27 years; happily married; no abuse.      Children: two daughters; three grandchildren.        Employment:  [employer] since 2015      Tobacco:  1-1.25  ppd x 20 years.      Alcohol: two to three drinks per month      Drugs: none      Exercise: daily; videos; floor  exercises; situps.  Jogging during lunch in 2017.        Seatbelt:  100%; no texting      Guns:  Loaded; secured.   Family History  Problem Relation Age of Onset  . Diabetes Mother   . Hypertension Mother   . Dementia Mother   . Hyperlipidemia Father   . Cancer Father     prostate  . Stroke Father     after prostate cancer surgery with post-op pneumonia.  . Aortic stenosis Sister   . Hypertension Sister   . Hyperlipidemia Brother   . Hypertension Brother   . Diabetes Brother   . Hyperlipidemia Brother   . Arthritis Brother   . Hypertension Brother   . Mental illness Brother     anxiety  . Stroke Brother     multiple TIAs.  . Breast cancer Neg Hx        Objective:    BP 122/74 (BP Location: Right Arm, Patient Position: Sitting, Cuff Size: Small)   Pulse 77   Temp 98.9 F (37.2 C) (Oral)   Resp 18   Ht 5\' 1"  (1.549 m)   Wt 149 lb 11.2 oz (67.9 kg)   LMP 08/17/2013 Comment: denies preg  SpO2 99%   BMI 28.29 kg/m  Physical Exam  Constitutional: She is oriented to person, place, and time. She appears well-developed and well-nourished. No distress.  HENT:  Head: Normocephalic and atraumatic.  Right Ear: External ear normal.  Left Ear: External ear normal.  Nose: Nose normal.  Mouth/Throat: Oropharynx is clear and moist.  Eyes: Conjunctivae and EOM are normal. Pupils are equal, round, and reactive to light.  Neck: Normal range of motion. Neck supple. Carotid bruit is not present. No thyromegaly present.  Cardiovascular: Normal rate, regular rhythm, normal heart sounds and intact distal pulses.  Exam reveals no gallop and no friction rub.   No murmur heard. Pulmonary/Chest: Effort normal and breath sounds normal. She has no wheezes. She has no rales.  Abdominal: Soft. Bowel sounds are normal. She exhibits no distension and no mass. There is no tenderness. There is no rebound and no guarding.  Lymphadenopathy:    She has no cervical adenopathy.  Neurological: She is  alert and oriented to person, place, and time. No cranial nerve deficit.  Skin: Skin is warm and dry. No rash noted. She is not diaphoretic. No erythema. No pallor.  Psychiatric: She has a normal mood and affect. Her behavior is normal.   Depression screen [facility] 2/9 05/18/2016 03/10/2016 12/02/2015 08/25/2015 06/17/2015  Decreased Interest 0 1 0 0 0  Down, Depressed, Hopeless 0 3 0 0 0  PHQ - 2 Score 0 4 0 0 0  Altered sleeping - 2 - - -  Tired, decreased energy - 2 - - -  Change in appetite - 1 - - -  Feeling bad or failure about  yourself  - 2 - - -  Trouble concentrating - 0 - - -  Moving slowly or fidgety/restless - 0 - - -  Suicidal thoughts - 0 - - -  PHQ-9 Score - 11 - - -  Difficult doing work/chores - Not difficult at all - - -   Office Spirometry Results: FEV1: 2.37 liters FVC: 2.92 liters FEV1/FVC: 81.2 % FVC  % Predicted: 91 liters FEV % Predicted: 93 liters FeF 25-75: 2.54 liters FeF 25-75 % Predicted: 97     Assessment & Plan:   1. Anxiety   2. Caregiver stress syndrome   3. Tobacco abuse   4. Gastroesophageal reflux disease without esophagitis   5. Bronchospasm    -counseling provided during visit. -missing one day of work per month to transport mother to appointments; will complete FMLA paperwork again to allow for one missed work day per month.  -continue Xanax PRN.  Using sparingly at this time. -excellent family support; highly encourage regular exercise for stress management. -obtain spirometry; highly encourage cessation of tobacco yet current stressors interfering with focus on cessation.  Spirometry normal despite symptoms; monitor closely.   Orders Placed This Encounter  Procedures  . Spirometry: [facility]   Meds ordered this encounter  Medications  . ALPRAZolam (XANAX) 0.5 MG tablet    Sig: Take 1 tablet (0.5 mg total) by mouth at bedtime as needed for anxiety.    Dispense:  30 tablet    Refill:  1    Return in about 3 months (around 11/18/2016) for  recheck.   Brigham Cobbins Paulita Fujita, M.D. Primary Care at [facility] [address] [place], [place]  [address] [phone] phone [phone] fax

## 2016-08-18 NOTE — Patient Instructions (Addendum)
   IF you received an x-ray today, you will receive an invoice from [facility]. Please contact [facility] at [phone] with questions or concerns regarding your invoice.   IF you received labwork today, you will receive an invoice from [facility]. Please contact [facility] at [phone] with questions or concerns regarding your invoice.   Our billing staff will not be able to assist you with questions regarding bills from these companies.  You will be contacted with the lab results as soon as they are available. The fastest way to get your results is to activate your My Chart account. Instructions are located on the last page of this paperwork. If you have not heard from us regarding the results in 2 weeks, please contact this office.    Alzheimer Disease Caregiver Guide Alzheimer disease is an illness that affects a person's brain. It causes a person to lose the ability to remember things and make good decisions. As the disease progresses, the person is unable to take care of himself or herself and needs more and more help to do simple tasks. Taking care of someone with Alzheimer disease can be very challenging and overwhelming. Memory loss and confusion Memory loss and confusion is mild in the beginning stages of the disease. Both of these problems become more severe as the disease progresses. Eventually, the person will not recognize places or even close family members and friends.  Stay calm.  Respond with a short explanation. Long explanations can be overwhelming and confusing.  Avoid corrections that sound like scolding.  Try not to take it personally, even if the person forgets your name. Behavior changes Behavior changes are part of the disease. The person may develop depression, anxiety, anger, hallucinations, or other behavior changes. These changes can come on suddenly and may be in response to pain, infection, changes in the environment (temperature,  noise), overstimulation, or feeling lost or scared.  Try not to take behavior changes personally.  Remain calm and patient.  Do not argue or try to convince the person about a specific point. This will only make him or her more agitated.  Know that the behavior changes are part of the disease process and try to work through it. Tips to reduce frustration  Schedule wisely by making appointments and doing daily tasks, like bathing and dressing, when the person is at his or her best.  Take your time. Simple tasks may take a lot longer, so be sure to allow for plenty of time.  Limit choices. Too many choices can be overwhelming and stressful for the person.  Involve the person in what you are doing.  Stick to a routine.  Avoid new or crowded situations, if possible.  Use simple words, short sentences, and a calm voice. Only give one direction at a time.  Buy clothes and shoes that are easy to put on and take off.  Let people help if they offer. Home safety Keeping the home safe is very important to reduce the risk of falls and injuries.  Keep floors clear of clutter. Remove rugs, magazine racks, and floor lamps.  Keep hallways well lit.  Put a handrail and nonslip mat in the bathtub or shower.  Put childproof locks on cabinets with dangerous items, such as medicine, alcohol, guns, toxic cleaning items, sharp tools or utensils, matches, or lighters.  Place locks on doors where the person cannot easily see or reach them. This helps ensure that the person cannot wander out of the house   and get lost.  Be prepared for emergencies. Keep a list of emergency phone numbers and addresses in a convenient area. Plans for the future  Do not put off talking about finances.  Talk about money management. People with Alzheimer disease have trouble managing their money as the disease gets worse.  Get help from professional advisors regarding financial and legal matters.  Do not put off  talking about future care.  Choose a power of attorney. This is someone who can make decisions for the person with Alzheimer disease when he or she is no longer able to do so.  Talk about driving and when it is the right time to stop. The person's health care provider can help give advice on this matter.  Talk about the person's living situation. If he or she lives alone, you need to make sure he or she is safe. Some people need extra help at home, and others need more care at a nursing home or care center. Support groups Joining a support group can be very helpful for caregivers of people with Alzheimer disease. Some advantages to being part of a support group include:  Getting strategies to manage stress.  Sharing experiences with others.  Receiving emotional comfort and support.  Learning new caregiving skills as the disease progresses.  Knowing what community resources are available and taking advantage of them. Contact a health care provider if:  The person has a fever.  The person has a sudden change in behavior that does not improve with calming strategies.  The person is unable to manage in his or her current living situation.  The person threatens you or anyone else, including himself or herself.  You are no longer able to care for the person. This information is not intended to replace advice given to you by your health care provider. Make sure you discuss any questions you have with your health care provider. Document Released: 02/10/2004 Document Revised: 11/12/2015 Document Reviewed: 07/07/2011 Elsevier Interactive Patient Education  2017 [employer]  

## 2016-08-27 ENCOUNTER — Telehealth: Payer: Self-pay | Admitting: Family Medicine

## 2016-08-27 NOTE — Telephone Encounter (Signed)
MyChart message sent to patient about cancelling her appt with Dr Katrinka BlazingSmith on 11/23/16 due to her being out of the office.  Please rescheduled her on her next opening

## 2016-11-23 ENCOUNTER — Ambulatory Visit: Payer: 59 | Admitting: Family Medicine

## 2016-12-29 ENCOUNTER — Encounter: Payer: 59 | Admitting: Family Medicine

## 2017-02-28 ENCOUNTER — Other Ambulatory Visit: Payer: Self-pay | Admitting: Family Medicine

## 2017-02-28 DIAGNOSIS — Z1231 Encounter for screening mammogram for malignant neoplasm of breast: Secondary | ICD-10-CM

## 2017-03-07 ENCOUNTER — Encounter (HOSPITAL_COMMUNITY): Payer: Self-pay | Admitting: Emergency Medicine

## 2017-03-07 ENCOUNTER — Ambulatory Visit (HOSPITAL_COMMUNITY)
Admission: EM | Admit: 2017-03-07 | Discharge: 2017-03-07 | Disposition: A | Discharge status: Home / Self Care | Payer: 59 | Attending: Family Medicine | Admitting: Family Medicine

## 2017-03-07 DIAGNOSIS — M722 Plantar fascial fibromatosis: Secondary | ICD-10-CM

## 2017-03-07 NOTE — ED Provider Notes (Signed)
[facility]    CSN: [id number] Arrival date & time: 03/07/17  1003     History   Chief Complaint Chief Complaint  Patient presents with  . Foot Pain    HPI Nichole Klein is a 51 y.o. female.   51 year old female with history of anxiety, arthritis, comes in for 2-3 month history of right heel pain. Patient states she has had 2 week history of low grade fever, which got her worried, and wondered if her right heel pain was the cause of it. She had dental work done 2 weeks ago, followed up with dentist 2 days ago. States dental work without problem, and was told to monitor to low-grade fever. Tmax of 100.5 this morning, took ibuprofen prior to arrival. Patient states apart from recent dental work and continued heel pain, no other complaints. Denies URI symptoms such as cough, congestion, sore throat. Denies urinary symptoms such as frequency, dysuria, hematuria. Denies weakness/dizziness. Patient states just feels heavy like when you have a fever.       Past Medical History:  Diagnosis Date  . Anxiety   . Arthritis    DDD lumbar  . Hematuria 06/15/2007   s/p urology consultation [name]; s/p CT scan negative.  No cystoscopy.  Marland Kitchen Herpes labialis     Patient Active Problem List   Diagnosis Date Noted  . Gastroesophageal reflux disease without esophagitis 05/31/2016  . Caregiver stress syndrome 04/04/2016  . Anxiety   . Tobacco abuse 07/14/2011    Past Surgical History:  Procedure Laterality Date  . NOSE SURGERY    . SPINE SURGERY  06/14/2008   Lumbar surgery L5-S1.  [place].    OB History    No data available       Home Medications    Prior to Admission medications   Medication Sig Start Date End Date Taking? Authorizing Provider  ALPRAZolam Prudy Feeler) 0.5 MG tablet Take 1 tablet (0.5 mg total) by mouth at bedtime as needed for anxiety. 08/18/16   Ethelda Chick, MD  ibuprofen (ADVIL,MOTRIN) 200 MG tablet Take 200 mg by mouth every 6 (six) hours as  needed. Reported on 12/02/2015    [provider]  ranitidine (ZANTAC) 150 MG tablet Take 150 mg by mouth 2 (two) times daily.    [provider]    Family History Family History  Problem Relation Age of Onset  . Diabetes Mother   . Hypertension Mother   . Dementia Mother   . Hyperlipidemia Father   . Cancer Father        prostate  . Stroke Father        after prostate cancer surgery with post-op pneumonia.  . Aortic stenosis Sister   . Hypertension Sister   . Hyperlipidemia Brother   . Hypertension Brother   . Diabetes Brother   . Hyperlipidemia Brother   . Arthritis Brother   . Hypertension Brother   . Mental illness Brother        anxiety  . Stroke Brother        multiple TIAs.  . Breast cancer Neg Hx     Social History Social History  Substance Use Topics  . Smoking status: Current Every Day Smoker    Packs/day: 1.50    Years: 25.00  . Smokeless tobacco: Never Used  . Alcohol use No     Comment: special occasions only.     Allergies   Patient has no known allergies.   Review of Systems  Review of Systems  Reason unable to perform ROS: See HPI as above.     Physical Exam Triage Vital Signs ED Triage Vitals [03/07/17 1030]  Enc Vitals Group     BP (!) 132/57     Pulse Rate 83     Resp 16     Temp 98.7 F (37.1 C)     Temp Source Oral     SpO2 97 %     Weight 150 lb (68 kg)     Height  (1.549 m)     Head Circumference      Peak Flow      Pain Score 5     Pain Loc      Pain Edu?      Excl. in GC?    No data found.   Updated Vital Signs BP (!) 132/57   Pulse 83   Temp 98.7 F (37.1 C) (Oral)   Resp 16   Ht  (1.549 m)   Wt 150 lb (68 kg)   LMP 08/17/2013 Comment: denies preg  SpO2 97%   BMI 28.34 kg/m    Physical Exam  Constitutional: She is oriented to person, place, and time. She appears well-developed and well-nourished. No distress.  HENT:  Head: Normocephalic and atraumatic.  Eyes: Pupils are  equal, round, and reactive to light. Conjunctivae are normal.  Musculoskeletal:  Tenderness on palpation of right heel, worse with dorsiflexion of toes. Full ROM of toes and ankle. Strength normal and equal bilaterally. Sensation intact. Pedal pulses 2+ and equal bilaterally. Cap refill < 2s  Neurological: She is alert and oriented to person, place, and time.     UC Treatments / Results  Labs (all labs ordered are listed, but only abnormal results are displayed) Labs Reviewed - No data to display  EKG  EKG Interpretation None       Radiology No results found.  Procedures Procedures (including critical care time)  Medications Ordered in UC Medications - No data to display   Initial Impression / Assessment and Plan / UC Course  I have reviewed the triage vital signs and the nursing notes.  Pertinent labs & imaging results that were available during my care of the patient were reviewed by me and considered in my medical decision making (see chart for details).    Discussed with patient exam consistent with tendinitis/planter fasciitis. Start NSAIDs and ice compress. Exercise instructions given. Discussed with patient no alarming signs today, and low grade fever could be due to virus passing through. Return precautions given.   Final Clinical Impressions(s) / UC Diagnoses   Final diagnoses:  Plantar fasciitis of right foot    New Prescriptions Discharge Medication List as of 03/07/2017 11:04 AM        Belinda Fisher, PA-C 03/07/17 2224

## 2017-03-07 NOTE — ED Triage Notes (Signed)
PT reports severe right heel pain for 2-3 months. PT reports low grade fever for 2 weeks. PT reports dental work 2 weeks ago. Fever started before dental work.

## 2017-03-07 NOTE — Discharge Instructions (Signed)
Take Ibuprofen  three times a day or Naproxen  twice a day for 10 days. Take the NSAIDs with food and your Zantac to prevent stomach upset. Ice compress of the heel. Follow exercise instructions on attachment to help stretch area. No alarming signs today. Your low grade fever could be due to a virus passing through. Monitor for any changes in symptoms, follow up with PCP if fever does not resolve.

## 2017-03-18 ENCOUNTER — Ambulatory Visit
Admission: RE | Admit: 2017-03-18 | Discharge: 2017-03-18 | Disposition: A | Discharge status: Home / Self Care | Payer: 59 | Source: Ambulatory Visit | Attending: Family Medicine | Admitting: Family Medicine

## 2017-03-18 DIAGNOSIS — Z1231 Encounter for screening mammogram for malignant neoplasm of breast: Secondary | ICD-10-CM | POA: Insufficient documentation

## 2017-04-18 ENCOUNTER — Other Ambulatory Visit: Payer: Self-pay | Admitting: Orthopaedic Surgery

## 2017-04-18 DIAGNOSIS — M5137 Other intervertebral disc degeneration, lumbosacral region: Secondary | ICD-10-CM

## 2017-04-18 DIAGNOSIS — S39012A Strain of muscle, fascia and tendon of lower back, initial encounter: Secondary | ICD-10-CM

## 2017-04-22 ENCOUNTER — Ambulatory Visit: Payer: PRIVATE HEALTH INSURANCE | Attending: Orthopaedic Surgery

## 2017-04-22 DIAGNOSIS — M2569 Stiffness of other specified joint, not elsewhere classified: Secondary | ICD-10-CM

## 2017-04-22 DIAGNOSIS — M2560 Stiffness of unspecified joint, not elsewhere classified: Secondary | ICD-10-CM | POA: Diagnosis present

## 2017-04-22 DIAGNOSIS — M545 Low back pain: Secondary | ICD-10-CM | POA: Insufficient documentation

## 2017-04-22 DIAGNOSIS — R2689 Other abnormalities of gait and mobility: Secondary | ICD-10-CM | POA: Diagnosis present

## 2017-04-22 NOTE — Therapy (Signed)
[place] [facility] [address] [place], [place], [address] Phone: [phone]   Fax:  [phone]  Physical Therapy Evaluation  Patient Details  Name: Nichole Klein MRN: [id number] Date of Birth: 1965-10-20 Referring Provider: Dr. Artelia Larocheavid Musante   Encounter Date: 04/22/2017  PT End of Session - 04/22/17 1128    Visit Number  1    Number of Visits  9    Date for PT Re-Evaluation  05/27/17    PT Start Time  1000    PT Stop Time  1104    PT Time Calculation (min)  64 min    Activity Tolerance  Patient limited by pain;Treatment limited secondary to medical complications (Comment)    Behavior During Therapy  [facility] for tasks assessed/performed;Agitated;Anxious       Past Medical History:  Diagnosis Date  . Anxiety   . Arthritis    DDD lumbar  . Hematuria 06/15/2007   s/p urology consultation Assunta GamblesBrian Cope; s/p CT scan negative.  No cystoscopy.  Marland Kitchen. Herpes labialis     Past Surgical History:  Procedure Laterality Date  . NOSE SURGERY    . SPINE SURGERY  06/14/2008   Lumbar surgery L5-S1.  [place].    There were no vitals filed for this visit.   Subjective Assessment - 04/22/17 1121    Subjective  Pt. is a pleasant 51 year old female who presents to physical therpay for lumbar strain/DDD after incident at work.     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 Felt a pop in the middle of back. This was October 22nd. Pain in the middle back and lower back, both feet and both calves pins and needles, constant. Pain going down left buttcheek, vagina is numb. X rays taken showing degenerative disc disease of back, had laminectomy 10 years ago at L5 S1. When patient lays down over heating pad it makes back feel better. Ice doesn't help at all. Was on prednisone and robaxen. Ortho doctor changed medications to flexoril and hydrocodone.     Limitations   Sitting;Reading;Lifting;Standing;Walking;Writing;House hold activities;Other (comment)    How long can you sit comfortably?  painful    How long can you stand comfortably?  painful    How long can you walk comfortably?  5 minutes    Diagnostic tests  X ray, will recieve MRI tomorrow    Patient Stated Goals  reduce pain    Currently in Pain?  Yes    Pain Score  9     Pain Location  Back    Pain Orientation  Lower;Mid    Pain Descriptors / Indicators  Aching;Pressure;Radiating;Grimacing;Spasm    Pain Type  Acute pain    Pain Radiating Towards  bilateral LE's and saddle region    Pain Onset  1 to 4 weeks ago    Pain Frequency  Constant    Aggravating Factors   movement    Pain Relieving Factors  medications    Effect of Pain on Daily Activities  limits all activities at this time         [facility] - 04/22/17 0001      Assessment   Medical Diagnosis  lumbar strain/ DDD    Referring Provider  Dr. Artelia Larocheavid Musante    Onset Date/Surgical Date  04/04/17    Hand Dominance  Right    Next MD Visit  Nov 16th  Prior Therapy  no      Precautions   Precautions  Back    Precaution Comments  no pushing, pulling over 5lb, no bending squatting, or side to side motion       Restrictions   Weight Bearing Restrictions  No      Balance Screen   Has the patient fallen in the past 6 months  No    Has the patient had a decrease in activity level because of a fear of falling?   Yes    Is the patient reluctant to leave their home because of a fear of falling?   Yes      Home Environment   Living Environment  Private residence    Living Arrangements  Spouse/significant other;Parent    Available Help at Discharge  Family    Type of Home  House    Home Access  Stairs to enter    Entrance Stairs-Number of Steps  4    Entrance Stairs-Rails  Right;Left    Home Layout  One level    Home Equipment  Grab bars - toilet;Grab bars - tub/shower      Prior Function   Level of Independence   Independent    Vocation Requirements  lifting, pulling, pushing    Leisure  enjoys crafts,       Cognition   Overall Cognitive Status  Within Functional Limits for tasks assessed      Observation/Other Assessments   Observations  pain dominant mindset, fearful of movement    Focus on Therapeutic Outcomes (FOTO)   MODI, FABQ      Sensation   Light Touch  Appears Intact    Proprioception  Impaired by gross assessment      Coordination   Gross Motor Movements are Fluid and Coordinated  No    Heel Shin Test  Grossly uncoordinated      Posture/Postural Control   Posture/Postural Control  Postural limitations    Postural Limitations  Decreased lumbar lordosis;Forward head      Transfers   Transfers  Sit to Stand;Stand to Sit;Stand Pivot Transfers;Squat Pivot Transfers;Independent with all Transfers    Sit to Stand  6: Modified independent (Device/Increase time)    Five time sit to stand comments   requires UE assistance, execssive time    Stand to Sit  6: Modified independent (Device/Increase time)    Stand Pivot Transfers  6: Modified independent (Device/Increase time)    Squat Pivot Transfers  6: Modified independent (Device/Increase time)      Ambulation/Gait   Ambulation/Gait  Yes    Ambulation/Gait Assistance  6: Modified independent (Device/Increase time)    Assistive device  None    Gait Pattern  Decreased arm swing - right;Decreased arm swing - left;Decreased stride length;Antalgic;Decreased trunk rotation    Gait velocity  .27m/s         PAIN: 9/10 Pain stays at 9/10   POSTURE: Pain avoidance   PROM/AROM: Unable to perform back ROM due to restrictions Unable to perform LE ROM due to pain   Slight L hip superior rotation noted, unable to assess further due to patient pain   Right Left  Shoulder Flexion [facility]  Shoulder Abduction Limited by pain ~100 degrees Limited by pain ~100 degrees  ER Limited by pain Limited by pain  IR Limited by pain Limited by pain      Right Left  Hip flexion Limited by pain Limited by pain  Hip Abduction Limited by pain  Limited by pain  Hip Adduction Limited by pain Limited by pain  Knee Extension  WFL painful WFL painful  Knee Flexion WFL painful WFL painful  DF WFL painful WFL painful  PF WFL painful WFL painful      STRENGTH:  Graded on a 0-5 scale Muscle Group Left Right  Shoulder flex    Shoulder Abd    Shoulder Ext    Shoulder IR/ER    Elbow    Wrist/hand    Hip Flex 2+/5 2+/5  Hip Abd 2+/5 2+/5  Hip Add 2+/5 2+/5  Hip Ext Too painful to assess Too painful too assess  Hip IR/ER    Knee Flex 3/5 3/5  Knee Ext 3/5 3/5  Ankle DF 3/5 3/5  Ankle PF 3/5 3/5   Strength limited by pain bilaterally   SENSATION: Sensation between limbs equal, pins and needles bilaterally  SPECIAL TESTS: + SLR + Slump   Unable to perform Single leg to chest Unable to perform double leg to chest   FUNCTIONAL MOBILITY: Educated on log rolling, required Mod cueing to perform   GAIT: Antalgic, no trunk rotation, no UE arm swing, decreased velocity and step length   OUTCOME MEASURES: TEST Outcome Interpretation  5 times sit<>stand 72 sec >60 yo, >15 sec indicates increased risk for falls  10 meter walk test      .43           m/s <1.0 m/s indicates increased risk for falls; limited community ambulator  MODI 58% Severe Disability  FABQ 85/96   FABQPA 20 Positive for fearful of physical activity  FABQQW 39 Increased risk of prolonged work disability/ restriction       Treat TrA activation  Log rolls Ankle pumps Walking program   Objective measurements completed on examination: See above findings.              PT Education - 04/22/17 1101    Education provided  Yes    Education Details  HEP, log rolling, ice and heat education    Person(s) Educated  Patient    Methods  Explanation;Tactile cues    Comprehension  Verbalized understanding;Returned demonstration       PT Short Term  Goals - 04/22/17 1139      PT SHORT TERM GOAL #1   Title  Patient will be independent in home exercise program to improve strength/mobility for better functional independence with ADLs.    Baseline  hep given    Time  2    Period  Weeks    Status  New    Target Date  05/06/17      PT SHORT TERM GOAL #2   Title  Patient will report a worst pain of 5/10 on VAS in back   to improve tolerance with ADLs and reduced symptoms with activities.     Baseline  9/10 pain    Time  2    Period  Weeks    Status  New    Target Date  05/06/17      PT SHORT TERM GOAL #3   Title  Patient will increase BLE gross strength to 4+/5 as to improve functional strength for independent gait, increased standing tolerance and increased ADL ability.    Baseline  2+/5 due to restriction of pain in ROM    Time  2    Period  Weeks    Status  New    Target Date  05/06/17  PT Long Term Goals - 04/22/17 1142      PT LONG TERM GOAL #1   Title  Patient will increase 10 meter walk test to >1.39m/s as to improve gait speed for better community ambulation and to reduce fall risk.    Baseline  11/9: .36m/s    Time  5    Period  Weeks    Status  New    Target Date  05/27/17      PT LONG TERM GOAL #2   Title  Patient will report a worst pain of 3/10 on VAS in back to improve tolerance with ADLs and reduced symptoms with activities.     Baseline  11/9: 9/10    Time  5    Period  Weeks    Status  New    Target Date  05/27/17      PT LONG TERM GOAL #3   Title  Patient will decrease FABQ scores by 10 points (75/96) to reduce pain avoidant behavior and improve quality of life.     Baseline  11/9: FABQ 85/96: FABQPA 20, FABQW: 39    Time  5    Period  Weeks    Status  New    Target Date  05/27/17      PT LONG TERM GOAL #4   Title  Patient will reduce modified Oswestry score to <20 as to demonstrate minimal disability with ADLs including improved sleeping tolerance, walking/sitting tolerance etc for better  mobility with ADLs.     Baseline  11/9: 58%    Time  5    Period  Weeks    Status  New    Target Date  05/27/17      PT LONG TERM GOAL #5   Title  Patient (< 4 years old) will complete five times sit to stand test in < 10 seconds indicating an increased LE strength and improved balance.    Baseline  72 seconds    Time  5    Period  Weeks    Status  New    Target Date  05/27/17             Plan - 04/22/17 1129    Clinical Impression Statement  Patient is a pleasant 51 year old woman who presents to physical therapy with low back pain.Numbness and tingling of LEs and vaginal reasoning noted throughout session limiting intervention and assessment due to potential indication for cauda equina syndrome.  Patient will receive MRI tomorrow.  Patient has pain avoidant mindset that limited evaluation. Back restrictions and neurological signs in saddle region limit patient ROM at this time. Patient is reluctant to move LE musculature due to exacerbation of pain in back. All LE and UE movements induce increased pain. Neurological symptoms positive throughout exam with + SLUMP and SLR. MODI 58%, FABQ: 85/96, FABQW: 39, FABQPA 39. Patient educated on log rolling technique, TrA activation, and maintaining ambulation. Patient will benefit from skilled physical therapy to reduce lumbar pain, increase mobility and improve quality of life.     History and Personal Factors relevant to plan of care:  This patient presents with 3, personal factors/ comorbidities and 4  body elements including body structures and functions, activity limitations and or participation restrictions. Patient's condition is unstable.    Clinical Presentation  Unstable    Clinical Presentation due to:  saddle region affected    Clinical Decision Making  High    Rehab Potential  Fair  Clinical Impairments Affecting Rehab Potential  saddle region affected, caregiver to mother, activity exacerbation (+) good compliance and  understanding of medical field     PT Frequency  2x / week    PT Duration  6 weeks    PT Treatment/Interventions  ADLs/Self Care Home Management;Cryotherapy;Ultrasound;Traction;Moist Heat;Iontophoresis 4mg /ml Dexamethasone;Electrical Stimulation;Gait training;Stair training;Functional mobility training;Neuromuscular re-education;Balance training;Therapeutic exercise;Therapeutic activities;Patient/family education;Passive range of motion;Manual techniques;Dry needling;Energy conservation;Taping    PT Next Visit Plan  review HEP, pain relief, review MRI    PT Home Exercise Plan  see sheet    Consulted and Agree with Plan of Care  Patient       Patient will benefit from skilled therapeutic intervention in order to improve the following deficits and impairments:  Abnormal gait, Decreased activity tolerance, Decreased coordination, Decreased balance, Decreased endurance, Decreased range of motion, Decreased mobility, Decreased strength, Difficulty walking, Impaired flexibility, Impaired perceived functional ability, Increased muscle spasms, Impaired sensation, Impaired UE functional use, Postural dysfunction, Improper body mechanics, Pain  Visit Diagnosis: Acute midline low back pain, with sciatica presence unspecified  Other abnormalities of gait and mobility  Decreased range of motion of trunk and back     Problem List Patient Active Problem List   Diagnosis Date Noted  . Gastroesophageal reflux disease without esophagitis 05/31/2016  . Caregiver stress syndrome 04/04/2016  . Anxiety   . Tobacco abuse 07/14/2011   Precious BardMarina Nicolette Gieske, PT, DPT   Precious BardMarina Ellen Mayol 04/22/2017, 11:47 AM   [facility] [address] [place], [place], [address] Phone: [phone]   Fax:  [phone]  Name: Nichole Klein MRN: [id number] Date of Birth: [birth year]

## 2017-04-23 ENCOUNTER — Ambulatory Visit
Admission: RE | Admit: 2017-04-23 | Discharge: 2017-04-23 | Disposition: A | Discharge status: Home / Self Care | Payer: PRIVATE HEALTH INSURANCE | Source: Ambulatory Visit | Attending: Orthopaedic Surgery | Admitting: Orthopaedic Surgery

## 2017-04-23 DIAGNOSIS — M5137 Other intervertebral disc degeneration, lumbosacral region: Secondary | ICD-10-CM | POA: Diagnosis not present

## 2017-04-23 DIAGNOSIS — S39012A Strain of muscle, fascia and tendon of lower back, initial encounter: Secondary | ICD-10-CM

## 2017-04-23 DIAGNOSIS — M4607 Spinal enthesopathy, lumbosacral region: Secondary | ICD-10-CM | POA: Insufficient documentation

## 2017-04-26 ENCOUNTER — Ambulatory Visit: Payer: PRIVATE HEALTH INSURANCE

## 2017-04-26 DIAGNOSIS — M2569 Stiffness of other specified joint, not elsewhere classified: Secondary | ICD-10-CM

## 2017-04-26 DIAGNOSIS — M545 Low back pain: Secondary | ICD-10-CM

## 2017-04-26 DIAGNOSIS — R2689 Other abnormalities of gait and mobility: Secondary | ICD-10-CM

## 2017-04-26 DIAGNOSIS — M2560 Stiffness of unspecified joint, not elsewhere classified: Secondary | ICD-10-CM

## 2017-04-26 NOTE — Therapy (Signed)
[place] [facility] [address] [place], [place], [address] Phone: [phone]   Fax:  [phone]  Physical Therapy Treatment  Patient Details  Name: Nichole CrazeRamona Kay Klein MRN: [id number] Date of Birth: June 16, 1965 Referring Provider: Dr. Artelia Larocheavid Musante   Encounter Date: 04/26/2017  PT End of Session - 04/26/17 1558    Visit Number  2    Number of Visits  9    Date for PT Re-Evaluation  05/27/17    PT Start Time  1600    PT Stop Time  1645    PT Time Calculation (min)  45 min    Activity Tolerance  Patient limited by pain;Treatment limited secondary to medical complications (Comment)    Behavior During Therapy  [facility] for tasks assessed/performed;Agitated;Anxious       Past Medical History:  Diagnosis Date  . Anxiety   . Arthritis    DDD lumbar  . Hematuria 06/15/2007   s/p urology consultation Assunta GamblesBrian Cope; s/p CT scan negative.  No cystoscopy.  Marland Kitchen. Herpes labialis     Past Surgical History:  Procedure Laterality Date  . NOSE SURGERY    . SPINE SURGERY  06/14/2008   Lumbar surgery L5-S1.  [place].    There were no vitals filed for this visit.  Subjective Assessment - 04/26/17 1615    Subjective  Pt. had lumbar spine MRI showing no disc bulg, mild stenosis.     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 Felt a pop in the middle of back. This was October 22nd. Pain in the middle back and lower back, both feet and both calves pins and needles, constant. Pain going down left buttcheek, vagina is numb. X rays taken showing degenerative disc disease of back, had laminectomy 10 years ago at L5 S1. When patient lays down over heating pad it makes back feel better. Ice doesn't help at all. Was on prednisone and robaxen. Ortho doctor changed medications to flexoril and hydrocodone.     Limitations  Sitting;Reading;Lifting;Standing;Walking;Writing;House hold  activities;Other (comment)    How long can you sit comfortably?  painful    How long can you stand comfortably?  painful    How long can you walk comfortably?  5 minutes    Diagnostic tests  X ray, will recieve MRI tomorrow    Patient Stated Goals  reduce pain    Currently in Pain?  Yes    Pain Score  8     Pain Location  Back    Pain Orientation  Lower;Mid    Pain Descriptors / Indicators  Aching;Pressure    Pain Type  Acute pain    Pain Onset  1 to 4 weeks ago        " No pushing, lifting, pulling over 5lb, no bending/twisting/crouching/crawling/stooping      STM paraspinals in sidelying with towel between knees  hamstring stretch , df overpressure Neural glides SLR 20x  Thoracic rotation with LE in hooklying Low back knee to chest stretch   TherEx TrA contraction 10x 90 90 neural glides 20x with leg on PTs shoulder for support  log roll              PT Education - 04/26/17 1558    Education provided  Yes    Education Details  Pain management    Person(s) Educated  Patient    Methods  Explanation;Demonstration;Verbal cues  Comprehension  Verbalized understanding;Returned demonstration       PT Short Term Goals - 04/22/17 1139      PT SHORT TERM GOAL #1   Title  Patient will be independent in home exercise program to improve strength/mobility for better functional independence with ADLs.    Baseline  hep given    Time  2    Period  Weeks    Status  New    Target Date  05/06/17      PT SHORT TERM GOAL #2   Title  Patient will report a worst pain of 5/10 on VAS in back   to improve tolerance with ADLs and reduced symptoms with activities.     Baseline  9/10 pain    Time  2    Period  Weeks    Status  New    Target Date  05/06/17      PT SHORT TERM GOAL #3   Title  Patient will increase BLE gross strength to 4+/5 as to improve functional strength for independent gait, increased standing tolerance and increased ADL ability.    Baseline  2+/5  due to restriction of pain in ROM    Time  2    Period  Weeks    Status  New    Target Date  05/06/17        PT Long Term Goals - 04/22/17 1142      PT LONG TERM GOAL #1   Title  Patient will increase 10 meter walk test to >1.61m/s as to improve gait speed for better community ambulation and to reduce fall risk.    Baseline  11/9: .53m/s    Time  5    Period  Weeks    Status  New    Target Date  05/27/17      PT LONG TERM GOAL #2   Title  Patient will report a worst pain of 3/10 on VAS in back to improve tolerance with ADLs and reduced symptoms with activities.     Baseline  11/9: 9/10    Time  5    Period  Weeks    Status  New    Target Date  05/27/17      PT LONG TERM GOAL #3   Title  Patient will decrease FABQ scores by 10 points (75/96) to reduce pain avoidant behavior and improve quality of life.     Baseline  11/9: FABQ 85/96: FABQPA 20, FABQW: 39    Time  5    Period  Weeks    Status  New    Target Date  05/27/17      PT LONG TERM GOAL #4   Title  Patient will reduce modified Oswestry score to <20 as to demonstrate minimal disability with ADLs including improved sleeping tolerance, walking/sitting tolerance etc for better mobility with ADLs.     Baseline  11/9: 58%    Time  5    Period  Weeks    Status  New    Target Date  05/27/17      PT LONG TERM GOAL #5   Title  Patient (< 41 years old) will complete five times sit to stand test in < 10 seconds indicating an increased LE strength and improved balance.    Baseline  72 seconds    Time  5    Period  Weeks    Status  New    Target Date  05/27/17  Plan - 04/26/17 1646    Clinical Impression Statement  Patient pain slightly decreased, however muscle guarding due to pain centric mindset limited therapy session. Patient requires slow movement with frequent cueing for breathing to allow for any prolonged holds to take place.Patient will benefit from physical therapy to reduce lumbar pain, increase  mobility and improve quality of life.      Rehab Potential  Fair    Clinical Impairments Affecting Rehab Potential  saddle region affected, caregiver to mother, activity exacerbation (+) good compliance and understanding of medical field     PT Frequency  2x / week    PT Duration  6 weeks    PT Treatment/Interventions  ADLs/Self Care Home Management;Cryotherapy;Ultrasound;Traction;Moist Heat;Iontophoresis 4mg /ml Dexamethasone;Electrical Stimulation;Gait training;Stair training;Functional mobility training;Neuromuscular re-education;Balance training;Therapeutic exercise;Therapeutic activities;Patient/family education;Passive range of motion;Manual techniques;Dry needling;Energy conservation;Taping    PT Next Visit Plan  review HEP, pain relief, review MRI    PT Home Exercise Plan  see sheet    Consulted and Agree with Plan of Care  Patient       Patient will benefit from skilled therapeutic intervention in order to improve the following deficits and impairments:  Abnormal gait, Decreased activity tolerance, Decreased coordination, Decreased balance, Decreased endurance, Decreased range of motion, Decreased mobility, Decreased strength, Difficulty walking, Impaired flexibility, Impaired perceived functional ability, Increased muscle spasms, Impaired sensation, Impaired UE functional use, Postural dysfunction, Improper body mechanics, Pain  Visit Diagnosis: Acute midline low back pain, with sciatica presence unspecified  Other abnormalities of gait and mobility  Decreased range of motion of trunk and back     Problem List Patient Active Problem List   Diagnosis Date Noted  . Gastroesophageal reflux disease without esophagitis 05/31/2016  . Caregiver stress syndrome 04/04/2016  . Anxiety   . Tobacco abuse 07/14/2011   Precious BardMarina Letica Giaimo, PT, DPT   Precious BardMarina Tayshun Gappa 04/26/2017, 4:47 PM  [facility] [address] [facility] [address] [place], [place],  [id number] Phone: [phone]   Fax:  [phone]  Name: Nichole CrazeRamona Kay Hosley MRN: [id number] Date of Birth: [birth year]

## 2017-04-28 ENCOUNTER — Ambulatory Visit: Payer: PRIVATE HEALTH INSURANCE | Admitting: Physical Therapy

## 2017-05-03 ENCOUNTER — Other Ambulatory Visit: Payer: Self-pay

## 2017-05-03 ENCOUNTER — Encounter: Payer: Self-pay | Admitting: Physical Therapy

## 2017-05-03 ENCOUNTER — Ambulatory Visit: Payer: PRIVATE HEALTH INSURANCE | Attending: Orthopaedic Surgery | Admitting: Physical Therapy

## 2017-05-03 VITALS — BP 162/68 | HR 84

## 2017-05-03 DIAGNOSIS — M545 Low back pain: Secondary | ICD-10-CM | POA: Insufficient documentation

## 2017-05-03 DIAGNOSIS — M2569 Stiffness of other specified joint, not elsewhere classified: Secondary | ICD-10-CM

## 2017-05-03 DIAGNOSIS — R2689 Other abnormalities of gait and mobility: Secondary | ICD-10-CM | POA: Insufficient documentation

## 2017-05-03 DIAGNOSIS — M2560 Stiffness of unspecified joint, not elsewhere classified: Secondary | ICD-10-CM | POA: Diagnosis present

## 2017-05-03 NOTE — Therapy (Signed)
[facility] MAIN [facility] SERVICES [address] [place], [place], [address] Phone: [phone]   Fax:  [phone]  Physical Therapy Treatment  Patient Details  Name: Nichole Klein MRN: [id number] Date of Birth: July 16, 1965 Referring Provider: Dr. Artelia Laroche   Encounter Date: 05/03/2017  PT End of Session - 05/03/17 1053    Visit Number  3    Number of Visits  9    Date for PT Re-Evaluation  05/27/17    PT Start Time  1054    PT Stop Time  1144    PT Time Calculation (min)  50 min    Activity Tolerance  Patient limited by pain;Treatment limited secondary to medical complications (Comment)    Behavior During Therapy  [facility] for tasks assessed/performed;Agitated;Anxious       Past Medical History:  Diagnosis Date  . Anxiety   . Arthritis    DDD lumbar  . Hematuria 06/15/2007   s/p urology consultation [name]; s/p CT scan negative.  No cystoscopy.  Marland Kitchen Herpes labialis     Past Surgical History:  Procedure Laterality Date  . NOSE SURGERY    . SPINE SURGERY  06/14/2008   Lumbar surgery L5-S1.  [place].    Vitals:   05/03/17 1056  BP: (!) 162/68  Pulse: 84  SpO2: 100%    Subjective Assessment - 05/03/17 1057    Subjective  Pt reports she saw his MD (Dr. Waymon Budge at [name]) since last session who told her the results of her MRI.  MD encouraged the pt to continue with PT, he additionally ordered back brace and TENS unit (pt has not received either yet).  MD is working on getting corisone injections improved by workers comp.  Pt reports her middle and lower back pain and pins and needles in LEs remain constant.  Pt reports she has been completing her HEP without questions or concerns. Pt reports that up to two days following PT her pain shoots up higher.      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 Felt a pop in the middle of back. This was October 22nd.  Pain in the middle back and lower back, both feet and both calves pins and needles, constant. Pain going down left buttcheek, vagina is numb. X rays taken showing degenerative disc disease of back, had laminectomy 10 years ago at L5 S1. When patient lays down over heating pad it makes back feel better. Ice doesn't help at all. Was on prednisone and robaxen. Ortho doctor changed medications to flexoril and hydrocodone.     Limitations  Sitting;Reading;Lifting;Standing;Walking;Writing;House hold activities;Other (comment)    How long can you sit comfortably?  painful    How long can you stand comfortably?  painful    How long can you walk comfortably?  5 minutes    Diagnostic tests  X ray, will recieve MRI tomorrow    Patient Stated Goals  reduce pain    Currently in Pain?  Yes    Pain Score  9     Pain Location  Back    Pain Orientation  Lower;Mid    Pain Descriptors / Indicators  Aching;Constant    Pain Onset  1 to 4 weeks ago    Pain Frequency  Constant    Pain Relieving Factors  heat    Multiple Pain Sites  No        TREATMENT  Manual Therapy:   STM Bil paraspinals in sidelying with towel between knees  Manual lumbar traction with belt with pt in hooklying. 4x30 seconds.  Very light pull as pt unable to tolerate mild>moderate pull.   Low back Bil knee to chest stretch 3x30 seconds  Hamstring stretch in supine, df overpressure. 3x60 seconds each LE    Therapeutic Exercise:   Neural glides in sitting 2x10 each LE with cues not to move past mild pain.  Pt reports she has been doing a modified version of this at home and admits she has been pushing it past mild pain.  Instructed pt in changing this at home to not past mild pain.   Seated trunk flexion mild stretch on theraball rolling ball forward with 10 second holds x10        PT Education - 05/03/17 1052    Education provided  Yes    Education Details  Exercise technique; reasoning for interventions; connection between  stress and negative thoughts and pain and the benefit of relaxation and stress reduction    Person(s) Educated  Patient    Methods  Explanation;Demonstration;Verbal cues    Comprehension  Verbalized understanding;Verbal cues required;Returned demonstration;Need further instruction       PT Short Term Goals - 04/22/17 1139      PT SHORT TERM GOAL #1   Title  Patient will be independent in home exercise program to improve strength/mobility for better functional independence with ADLs.    Baseline  hep given    Time  2    Period  Weeks    Status  New    Target Date  05/06/17      PT SHORT TERM GOAL #2   Title  Patient will report a worst pain of 5/10 on VAS in back   to improve tolerance with ADLs and reduced symptoms with activities.     Baseline  9/10 pain    Time  2    Period  Weeks    Status  New    Target Date  05/06/17      PT SHORT TERM GOAL #3   Title  Patient will increase BLE gross strength to 4+/5 as to improve functional strength for independent gait, increased standing tolerance and increased ADL ability.    Baseline  2+/5 due to restriction of pain in ROM    Time  2    Period  Weeks    Status  New    Target Date  05/06/17        PT Long Term Goals - 04/22/17 1142      PT LONG TERM GOAL #1   Title  Patient will increase 10 meter walk test to >1.38m/s as to improve gait speed for better community ambulation and to reduce fall risk.    Baseline  11/9: .88m/s    Time  5    Period  Weeks    Status  New    Target Date  05/27/17      PT LONG TERM GOAL #2   Title  Patient will report a worst pain of 3/10 on VAS in back to improve tolerance with ADLs and reduced symptoms with activities.     Baseline  11/9: 9/10    Time  5    Period  Weeks    Status  New    Target Date  05/27/17      PT LONG TERM GOAL #3   Title  Patient will decrease FABQ scores  by 10 points (75/96) to reduce pain avoidant behavior and improve quality of life.     Baseline  11/9: FABQ 85/96:  FABQPA 20, FABQW: 39    Time  5    Period  Weeks    Status  New    Target Date  05/27/17      PT LONG TERM GOAL #4   Title  Patient will reduce modified Oswestry score to <20 as to demonstrate minimal disability with ADLs including improved sleeping tolerance, walking/sitting tolerance etc for better mobility with ADLs.     Baseline  11/9: 58%    Time  5    Period  Weeks    Status  New    Target Date  05/27/17      PT LONG TERM GOAL #5   Title  Patient (< 50 years old) will complete five times sit to stand test in < 10 seconds indicating an increased LE strength and improved balance.    Baseline  72 seconds    Time  5    Period  Weeks    Status  New    Target Date  05/27/17            Plan - 05/03/17 1132    Clinical Impression Statement  Pt presents with a very guarded posture with all static and dynamic activities, limiting intervention options and progression.  She demonstrates valsalva maneuver with almost every exercise and requires cues for proper deep breathing and relaxation for decreased tension and pain.  Pt educated in proper intensity of neural glides as she has been pushing past mild pain with nerve glides at home.  Introduced light manual lumbar traction in an effort to decrease tension and pain and to relieve pressure from DDD in back.  Pt educated on the importance of performing all therapeutic exercises to her tolerance not past mild pain with a gradual progression as able.  Pt will benefit from a graded and slowly progressed intervention plan due to her heightened pain response.  Pt reports she does breathing exercises for stress reduction and this PT reinforced utilizing this tool on a regular basis and whenever her pain increases.  She will benefit from continued skilled PT interventions for decreased pain and improved QOL.     Rehab Potential  Fair    Clinical Impairments Affecting Rehab Potential  saddle region affected, caregiver to mother, activity exacerbation  (+) good compliance and understanding of medical field     PT Frequency  2x / week    PT Duration  6 weeks    PT Treatment/Interventions  ADLs/Self Care Home Management;Cryotherapy;Ultrasound;Traction;Moist Heat;Iontophoresis 4mg /ml Dexamethasone;Electrical Stimulation;Gait training;Stair training;Functional mobility training;Neuromuscular re-education;Balance training;Therapeutic exercise;Therapeutic activities;Patient/family education;Passive range of motion;Manual techniques;Dry needling;Energy conservation;Taping    PT Next Visit Plan  assess response to HEP not pushing past mild pain; reinforce benefits of stress reduction/breathing relaxation techniques; consider providing education video on the connection between pain and thoughts/emotions/stress    PT Home Exercise Plan  see sheet    Consulted and Agree with Plan of Care  Patient       Patient will benefit from skilled therapeutic intervention in order to improve the following deficits and impairments:  Abnormal gait, Decreased activity tolerance, Decreased coordination, Decreased balance, Decreased endurance, Decreased range of motion, Decreased mobility, Decreased strength, Difficulty walking, Impaired flexibility, Impaired perceived functional ability, Increased muscle spasms, Impaired sensation, Impaired UE functional use, Postural dysfunction, Improper body mechanics, Pain  Visit Diagnosis: Acute midline low back pain, with  sciatica presence unspecified  Other abnormalities of gait and mobility  Decreased range of motion of trunk and back     Problem List Patient Active Problem List   Diagnosis Date Noted  . Gastroesophageal reflux disease without esophagitis 05/31/2016  . Caregiver stress syndrome 04/04/2016  . Anxiety   . Tobacco abuse 07/14/2011    Encarnacion ChuAshley Abashian PT, DPT 05/03/2017, 11:48 AM   [facility] [address] [place], [place], [address] Phone:  [phone]   Fax:  [phone]  Name: Nichole Klein MRN: [id number] Date of Birth: 10-05-1965

## 2017-05-09 ENCOUNTER — Ambulatory Visit: Payer: PRIVATE HEALTH INSURANCE | Admitting: Physical Therapy

## 2017-05-09 ENCOUNTER — Other Ambulatory Visit: Payer: Self-pay

## 2017-05-09 ENCOUNTER — Encounter: Payer: Self-pay | Admitting: Physical Therapy

## 2017-05-09 VITALS — BP 142/108 | HR 94

## 2017-05-09 DIAGNOSIS — M545 Low back pain: Secondary | ICD-10-CM | POA: Diagnosis not present

## 2017-05-09 DIAGNOSIS — M2569 Stiffness of other specified joint, not elsewhere classified: Secondary | ICD-10-CM

## 2017-05-09 DIAGNOSIS — R2689 Other abnormalities of gait and mobility: Secondary | ICD-10-CM

## 2017-05-09 DIAGNOSIS — M2560 Stiffness of unspecified joint, not elsewhere classified: Secondary | ICD-10-CM

## 2017-05-09 NOTE — Therapy (Signed)
[facility] [facility] [address] [place], [place], [address] Phone: [phone]   Fax:  [phone]  Physical Therapy Treatment  Patient Details  Name: Nichole Klein MRN: [id number] Date of Birth: [birth year] Referring Provider: Dr. Artelia Larocheavid Musante   Encounter Date: 05/09/2017  PT End of Session - 05/09/17 1032    Visit Number  4    Number of Visits  9    Date for PT Re-Evaluation  05/27/17    PT Start Time  1032    PT Stop Time  1122    PT Time Calculation (min)  50 min    Activity Tolerance  Patient limited by pain;Treatment limited secondary to medical complications (Comment)    Behavior During Therapy  [facility] for tasks assessed/performed;Agitated;Anxious       Past Medical History:  Diagnosis Date  . Anxiety   . Arthritis    DDD lumbar  . Hematuria 06/15/2007   s/p urology consultation Assunta GamblesBrian Cope; s/p CT scan negative.  No cystoscopy.  Marland Kitchen. Herpes labialis     Past Surgical History:  Procedure Laterality Date  . NOSE SURGERY    . SPINE SURGERY  06/14/2008   Lumbar surgery L5-S1.  [place].    Vitals:   05/09/17 1034  BP: (!) 142/108  Pulse: 94    Subjective Assessment - 05/09/17 1034    Subjective  Pt reports she continues to have pain for two days following PT sessions.  She believes that the traction caused her sciatica to worsen.  She has been completing her HEP every day without abdnormal response.      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 Felt a pop in the middle of back. This was October 22nd. Pain in the middle back and lower back, both feet and both calves pins and needles, constant. Pain going down left buttcheek, vagina is numb. X rays taken showing degenerative disc disease of back, had laminectomy 10 years ago at L5 S1. When patient lays down over heating pad it makes back feel better. Ice doesn't help at all. Was on prednisone  and robaxen. Ortho doctor changed medications to flexoril and hydrocodone.     Limitations  Sitting;Reading;Lifting;Standing;Walking;Writing;House hold activities;Other (comment)    How long can you sit comfortably?  painful    How long can you stand comfortably?  painful    How long can you walk comfortably?  5 minutes    Diagnostic tests  X ray, will recieve MRI tomorrow    Patient Stated Goals  reduce pain    Currently in Pain?  Yes    Pain Score  8     Pain Location  Back feet, legs, L buttocks    Pain Orientation  Left;Lower    Pain Descriptors / Indicators  Aching    Pain Type  Chronic pain    Pain Onset  More than a month ago    Pain Frequency  Constant    Multiple Pain Sites  No        TREATMENT   Watched video on the relationship between pain and stress/emotions with discussion on how this relates to her pain and stress about her pain.  Encouraged pt to continue to use strategies to distract herself from her pain when she begins to feel frustrated.  (5 minutes unbilled)   Manual Therapy:?   STM Bil paraspinals in sidelying with towel between knees  Therapeutic Exercise:   Clamshells 2x10 each LE with cues to avoid rotation and for glute activation. Significant fatigue noted with this due to glute weakness.   Hooklying isometric clamshells with belt with 10 second holds x10   Seated thoracic AROM into rotation, sidebending, flexion, and extension within tolerance and not past mild pain x10 each direction. (rotation and sidebending added to HEP)   Seated trunk flexion mild stretch on theraball rolling ball forward with 10 second holds x10 and to the R with 10 second holds x10   Posterior pelvic tilts in chair with back of chair as tactile feedback. x20. Focus on activation of core.          PT Education - 05/09/17 1031    Education provided  Yes    Education Details  Exercise technique; pain science    Person(s) Educated  Patient    Methods   Explanation;Demonstration;Verbal cues;Other (comment) pain science video    Comprehension  Verbalized understanding;Returned demonstration;Verbal cues required;Need further instruction       PT Short Term Goals - 04/22/17 1139      PT SHORT TERM GOAL #1   Title  Patient will be independent in home exercise program to improve strength/mobility for better functional independence with ADLs.    Baseline  hep given    Time  2    Period  Weeks    Status  New    Target Date  05/06/17      PT SHORT TERM GOAL #2   Title  Patient will report a worst pain of 5/10 on VAS in back   to improve tolerance with ADLs and reduced symptoms with activities.     Baseline  9/10 pain    Time  2    Period  Weeks    Status  New    Target Date  05/06/17      PT SHORT TERM GOAL #3   Title  Patient will increase BLE gross strength to 4+/5 as to improve functional strength for independent gait, increased standing tolerance and increased ADL ability.    Baseline  2+/5 due to restriction of pain in ROM    Time  2    Period  Weeks    Status  New    Target Date  05/06/17        PT Long Term Goals - 04/22/17 1142      PT LONG TERM GOAL #1   Title  Patient will increase 10 meter walk test to >1.2539m/s as to improve gait speed for better community ambulation and to reduce fall risk.    Baseline  11/9: .2943m/s    Time  5    Period  Weeks    Status  New    Target Date  05/27/17      PT LONG TERM GOAL #2   Title  Patient will report a worst pain of 3/10 on VAS in back to improve tolerance with ADLs and reduced symptoms with activities.     Baseline  11/9: 9/10    Time  5    Period  Weeks    Status  New    Target Date  05/27/17      PT LONG TERM GOAL #3   Title  Patient will decrease FABQ scores by 10 points (75/96) to reduce pain avoidant behavior and improve quality of life.     Baseline  11/9: FABQ 85/96: FABQPA 20, FABQW: 39    Time  5  Period  Weeks    Status  New    Target Date  05/27/17       PT LONG TERM GOAL #4   Title  Patient will reduce modified Oswestry score to <20 as to demonstrate minimal disability with ADLs including improved sleeping tolerance, walking/sitting tolerance etc for better mobility with ADLs.     Baseline  11/9: 58%    Time  5    Period  Weeks    Status  New    Target Date  05/27/17      PT LONG TERM GOAL #5   Title  Patient (< 19 years old) will complete five times sit to stand test in < 10 seconds indicating an increased LE strength and improved balance.    Baseline  72 seconds    Time  5    Period  Weeks    Status  New    Target Date  05/27/17            Plan - 05/09/17 1100    Clinical Impression Statement  Aside from [facility] to L lumbar paraspinals, focused today's efforts on low intensity therapeutic exercises completed to patient's tolerance.  Will assess at future sessions the pt's response to this focused intervention approach to assess what pt will benefit from moving forward.  Watched educational video on pain science and the effect of emotions and stress on pain followed by discussion on stress reduction, especially when her pain increases.  Introduced thoracic AROM and added to HEP to decrease guarded posture and tightness in trunk to promote more mobility.  Pt did not report increased pain during this session.  She will benefit from continued skilled PT interventions for decreased pain and improved QOL.     Rehab Potential  Fair    Clinical Impairments Affecting Rehab Potential  saddle region affected, caregiver to mother, activity exacerbation (+) good compliance and understanding of medical field     PT Frequency  2x / week    PT Duration  6 weeks    PT Treatment/Interventions  ADLs/Self Care Home Management;Cryotherapy;Ultrasound;Traction;Moist Heat;Iontophoresis 4mg /ml Dexamethasone;Electrical Stimulation;Gait training;Stair training;Functional mobility training;Neuromuscular re-education;Balance training;Therapeutic exercise;Therapeutic  activities;Patient/family education;Passive range of motion;Manual techniques;Dry needling;Energy conservation;Taping    PT Next Visit Plan  assess response to low level therapeutic exercise intervention to decide appropriate progression    PT Home Exercise Plan  see sheet    Consulted and Agree with Plan of Care  Patient       Patient will benefit from skilled therapeutic intervention in order to improve the following deficits and impairments:  Abnormal gait, Decreased activity tolerance, Decreased coordination, Decreased balance, Decreased endurance, Decreased range of motion, Decreased mobility, Decreased strength, Difficulty walking, Impaired flexibility, Impaired perceived functional ability, Increased muscle spasms, Impaired sensation, Impaired UE functional use, Postural dysfunction, Improper body mechanics, Pain  Visit Diagnosis: Acute midline low back pain, with sciatica presence unspecified  Other abnormalities of gait and mobility  Decreased range of motion of trunk and back     Problem List Patient Active Problem List   Diagnosis Date Noted  . Gastroesophageal reflux disease without esophagitis 05/31/2016  . Caregiver stress syndrome 04/04/2016  . Anxiety   . Tobacco abuse 07/14/2011    Encarnacion Chu PT, DPT 05/09/2017, 11:23 AM  [facility] MAIN [facility] SERVICES [address] [place], [place], [address] Phone: [phone]   Fax:  [phone]  Name: Nichole Klein MRN: [id number] Date of Birth: [birth year]

## 2017-05-11 ENCOUNTER — Ambulatory Visit (INDEPENDENT_AMBULATORY_CARE_PROVIDER_SITE_OTHER): Payer: 59 | Admitting: Family Medicine

## 2017-05-11 ENCOUNTER — Encounter: Payer: Self-pay | Admitting: Family Medicine

## 2017-05-11 ENCOUNTER — Other Ambulatory Visit: Payer: Self-pay

## 2017-05-11 VITALS — BP 142/80 | HR 82 | Temp 98.3°F | Resp 18 | Ht 61.69 in | Wt 175.0 lb

## 2017-05-11 DIAGNOSIS — M5441 Lumbago with sciatica, right side: Secondary | ICD-10-CM

## 2017-05-11 DIAGNOSIS — M5442 Lumbago with sciatica, left side: Secondary | ICD-10-CM

## 2017-05-11 NOTE — Progress Notes (Signed)
Subjective:    Patient ID: Nichole Klein, female    DOB: [birth year], 51 y.o.   MRN: [id number]  05/11/2017  FMLA Paperwork and Back Pain    HPI This 51 y.o. female presents for evaluation of  Thoracic and lumbar back pain. Injured self when fell into someone.   No pain intially.  Heard a pop.  Then that night, back started tightening up.  Then developed tingling in L foot.  Referred to provider.  S/p MRI.  Physical Therapy.  Taking hydrocodone; in horrible pain.  Out of work the entire time.  Thinks may need FMLA completed.   Ordered back brace, TENS unit, lower back injection.   Unable to work because taking hydrocodone every four hours.   Denies weakness in legs yet having numbness/tingling/burning in legs. No bowel or bladder dysfunction.  No saddle paresthesias.  Mother is stable yet must take care of mother regardless of back pain.  BP Readings from Last 3 Encounters:  05/11/17 (!) 142/80  05/09/17 (!) 142/108  05/03/17 (!) 162/68   Wt Readings from Last 3 Encounters:  05/11/17 175 lb (79.4 kg)  03/07/17 150 lb (68 kg)  08/18/16 149 lb 11.2 oz (67.9 kg)   Immunization History  Administered Date(s) Administered  . Influenza Split 05/15/2012, 04/07/2013  . Influenza-Unspecified 06/11/2014, 03/03/2016  . Pneumococcal Polysaccharide-23 07/10/2012  . Tdap 06/14/2009    Review of Systems  Constitutional: Negative for chills, diaphoresis, fatigue and fever.  Eyes: Negative for visual disturbance.  Respiratory: Negative for cough and shortness of breath.   Cardiovascular: Negative for chest pain, palpitations and leg swelling.  Gastrointestinal: Negative for abdominal pain, constipation, diarrhea, nausea and vomiting.  Endocrine: Negative for cold intolerance, heat intolerance, polydipsia, polyphagia and polyuria.  Genitourinary: Negative for difficulty urinating.  Musculoskeletal: Positive for back pain.  Neurological: Positive for numbness. Negative for dizziness,  tremors, seizures, syncope, facial asymmetry, speech difficulty, weakness, light-headedness and headaches.    Past Medical History:  Diagnosis Date  . Anxiety   . Arthritis    DDD lumbar  . Hematuria 06/15/2007   s/p urology consultation [name]; s/p CT scan negative.  No cystoscopy.  Marland Kitchen Herpes labialis    Past Surgical History:  Procedure Laterality Date  . NOSE SURGERY    . SPINE SURGERY  06/14/2008   Lumbar surgery L5-S1.  [place].   No Known Allergies Current Outpatient Medications on File Prior to Visit  Medication Sig Dispense Refill  . ALPRAZolam (XANAX) 0.5 MG tablet Take 1 tablet (0.5 mg total) by mouth at bedtime as needed for anxiety. 30 tablet 1  . cyclobenzaprine (FLEXERIL) 5 MG tablet Take 5 mg by mouth at bedtime.    Marland Kitchen HYDROcodone-acetaminophen (NORCO/VICODIN) 5-325 MG tablet Take 1 tablet by mouth every 6 (six) hours as needed for moderate pain.    . naproxen (NAPROSYN) 500 MG tablet Take 500 mg by mouth 3 (three) times daily.    . ranitidine (ZANTAC) 150 MG tablet Take 150 mg by mouth 2 (two) times daily.    Marland Kitchen ibuprofen (ADVIL,MOTRIN) 200 MG tablet Take 200 mg by mouth every 6 (six) hours as needed. Reported on 12/02/2015     No current facility-administered medications on file prior to visit.    Social History   Socioeconomic History  . Marital status: Married    Spouse name: [name]  . Number of children: 2  . Years of education: 87  . Highest education level: Not on file  Social Needs  .  Financial resource strain: Not on file  . Food insecurity - worry: Not on file  . Food insecurity - inability: Not on file  . Transportation needs - medical: Not on file  . Transportation needs - non-medical: Not on file  Occupational History  . Occupation: CMA    Employer: [employer]    Comment: [employer]  Tobacco Use  . Smoking status: Current Every Day Smoker    Packs/day: 1.50    Years: 25.00    Pack years: 37.50  . Smokeless tobacco:  Never Used  Substance and Sexual Activity  . Alcohol use: No    Comment: special occasions only.  . Drug use: No  . Sexual activity: Yes    Birth control/protection: Surgical    Comment: Husband with vasectomy.  Other Topics Concern  . Not on file  Social History Narrative   Marital status: married x 27 years; happily married; no abuse.      Children: two daughters; three grandchildren.        Employment:  [employer] since 2015      Tobacco:  1-1.25  ppd x 20 years.      Alcohol: two to three drinks per month      Drugs: none      Exercise: daily; videos; floor exercises; situps.  Jogging during lunch in 2017.        Seatbelt:  100%; no texting      Guns:  Loaded; secured.   Family History  Problem Relation Age of Onset  . Diabetes Mother   . Hypertension Mother   . Dementia Mother   . Hyperlipidemia Father   . Cancer Father        prostate  . Stroke Father        after prostate cancer surgery with post-op pneumonia.  . Aortic stenosis Sister   . Hypertension Sister   . Hyperlipidemia Brother   . Hypertension Brother   . Diabetes Brother   . Hyperlipidemia Brother   . Arthritis Brother   . Hypertension Brother   . Mental illness Brother        anxiety  . Stroke Brother        multiple TIAs.  . Breast cancer Neg Hx        Objective:    BP (!) 142/80 (BP Location: Left Arm, Patient Position: Sitting, Cuff Size: Normal)   Pulse 82   Temp 98.3 F (36.8 C) (Oral)   Resp 18   Ht 5' 1.69" (1.567 m)   Wt 175 lb (79.4 kg)   LMP 08/17/2013 Comment: denies preg  SpO2 98%   BMI 32.33 kg/m  Physical Exam  Constitutional: She is oriented to person, place, and time. She appears well-developed and well-nourished. She appears distressed.  HENT:  Head: Normocephalic and atraumatic.  Right Ear: External ear normal.  Left Ear: External ear normal.  Nose: Nose normal.  Mouth/Throat: Oropharynx is clear and moist.  Eyes: Conjunctivae and EOM are normal. Pupils  are equal, round, and reactive to light.  Neck: Normal range of motion. Neck supple. Carotid bruit is not present.  Pulmonary/Chest: Effort normal and breath sounds normal. No respiratory distress.  Musculoskeletal:       Lumbar back: She exhibits decreased range of motion and pain.  Neurological: She is alert and oriented to person, place, and time. No cranial nerve deficit.  Skin: Skin is warm and dry. No rash noted. She is not diaphoretic. No erythema. No pallor.  Psychiatric: She has a normal mood and affect. Her behavior is normal.   No results found. [facility] screen [facility] 05/11/2017 05/18/2016 03/10/2016 12/02/2015 08/25/2015  Decreased Interest 0 0 1 0 0  Down, Depressed, Hopeless 0 0 3 0 0  PHQ - 2 Score 0 0 4 0 0  Altered sleeping - - 2 - -  Tired, decreased energy - - 2 - -  Change in appetite - - 1 - -  Feeling bad or failure about yourself  - - 2 - -  Trouble concentrating - - 0 - -  Moving slowly or fidgety/restless - - 0 - -  Suicidal thoughts - - 0 - -  PHQ-9 Score - - 11 - -  Difficult doing work/chores - - Not difficult at all - -   Fall Risk  05/11/2017 03/10/2016 12/02/2015 03/15/2015  Falls in the past year? No No No No        Assessment & Plan:   1. Acute bilateral low back pain with bilateral sciatica     Acute lower back pain with bilateral radiation into legs with numbness and tingling.  Undergoing evaluation by orthopedics.  Status post MRI and physical therapy.  Scheduled for injection next week.  Patient is having to take hydrocodone every 4-6 hours to control pain.  Patient has been missing work due to opiate requirement.  Patient works in a medical facility with direct patient care and does not feel comfortable providing patient care while maintained on opiates.  Patient Neet may need FMLA paperwork completed and I am happy to complete those forms for her.  We will need to obtain records from orthopedist.    No orders of the defined types were placed in  this encounter.  No orders of the defined types were placed in this encounter.   No Follow-up on file.   Aceyn Kathol Paulita FujitaMartin Tajha Sammarco, M.D. [facility]  [place] [facility] [address] [place], [place]  [address] [phone] phone [phone] fax

## 2017-05-11 NOTE — Patient Instructions (Signed)
     IF you received an x-ray today, you will receive an invoice from [facility]. Please contact [facility] at [phone] with questions or concerns regarding your invoice.   IF you received labwork today, you will receive an invoice from [facility]. Please contact [facility] at [phone] with questions or concerns regarding your invoice.   Our billing staff will not be able to assist you with questions regarding bills from these companies.  You will be contacted with the lab results as soon as they are available. The fastest way to get your results is to activate your My Chart account. Instructions are located on the last page of this paperwork. If you have not heard from us regarding the results in 2 weeks, please contact this office.     

## 2017-05-12 ENCOUNTER — Ambulatory Visit: Payer: PRIVATE HEALTH INSURANCE

## 2017-05-12 DIAGNOSIS — M545 Low back pain: Secondary | ICD-10-CM | POA: Diagnosis not present

## 2017-05-12 DIAGNOSIS — R2689 Other abnormalities of gait and mobility: Secondary | ICD-10-CM

## 2017-05-12 DIAGNOSIS — M2569 Stiffness of other specified joint, not elsewhere classified: Secondary | ICD-10-CM

## 2017-05-12 DIAGNOSIS — M2560 Stiffness of unspecified joint, not elsewhere classified: Secondary | ICD-10-CM

## 2017-05-12 NOTE — Therapy (Signed)
[facility] [address] [place], [place], [address] Phone: [phone]   Fax:  [phone]  Physical Therapy Treatment  Patient Details  Name: Nichole Klein MRN: [id number] Date of Birth: 03-21-1966 Referring Provider: Dr. Artelia Larocheavid Musante   Encounter Date: 05/12/2017  PT End of Session - 05/12/17 1200    Visit Number  5    Number of Visits  9    Date for PT Re-Evaluation  05/27/17    PT Start Time  1100    PT Stop Time  1146    PT Time Calculation (min)  46 min    Activity Tolerance  Patient limited by pain;Treatment limited secondary to medical complications (Comment)    Behavior During Therapy  [facility] for tasks assessed/performed;Agitated;Anxious       Past Medical History:  Diagnosis Date  . Anxiety   . Arthritis    DDD lumbar  . Hematuria 06/15/2007   s/p urology consultation Assunta GamblesBrian Cope; s/p CT scan negative.  No cystoscopy.  Marland Kitchen. Herpes labialis     Past Surgical History:  Procedure Laterality Date  . NOSE SURGERY    . SPINE SURGERY  06/14/2008   Lumbar surgery L5-S1.  [place].    There were no vitals filed for this visit.  Subjective Assessment - 05/12/17 1102    Subjective  Patient reports she will be getting an injection next friday. Also getting a tens unit and a back brace.     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 Felt a pop in the middle of back. This was October 22nd. Pain in the middle back and lower back, both feet and both calves pins and needles, constant. Pain going down left buttcheek, vagina is numb. X rays taken showing degenerative disc disease of back, had laminectomy 10 years ago at L5 S1. When patient lays down over heating pad it makes back feel better. Ice doesn't help at all. Was on prednisone and robaxen. Ortho doctor changed medications to flexoril and hydrocodone.     Limitations   Sitting;Reading;Lifting;Standing;Walking;Writing;House hold activities;Other (comment)    How long can you sit comfortably?  painful    How long can you stand comfortably?  painful    How long can you walk comfortably?  5 minutes    Diagnostic tests  X ray, will recieve MRI tomorrow    Patient Stated Goals  reduce pain    Currently in Pain?  Yes    Pain Score  9     Pain Location  Back    Pain Orientation  Mid;Lower    Pain Descriptors / Indicators  Aching;Stabbing    Pain Type  Chronic pain;Acute pain    Pain Radiating Towards  pain into L buttocks, R foot    Pain Onset  More than a month ago       Manual Therapy:?   STM Bil paraspinals in sidelying with towel between knees  Education on sensory acceptance of back, have husband rub back with towel to decrease sensory rejection.    Therapeutic Exercise:     RLE Sidelying isometric clamshells against PT hands with 10 second holds x12,  Sidelying isometric glute extension against PT hands 10 sec x 12 Sidelying isometric adduction against self 10 sec, 12   Posterior pelvic tilts in chair with back of chair as tactile feedback. x20. Focus on activation of core.   Seated hands  on knees raise to 90 90 for core activation and return to hand son knees 10x   education on sleeping position: position pillow between knees when sidelying, under pelvis when on stomach.   Log rolling tranfers. x2  Ultrasound in sidelying 10 mins, 1 mHz, 2 cycles 50%, warmer head, 1.5w/cm. Pt. Positioned with towel between knees. Performed to R low back                      PT Education - 05/12/17 1159    Education provided  Yes    Education Details  sleeping positions, core activation    Person(s) Educated  Patient    Methods  Explanation    Comprehension  Verbalized understanding;Returned demonstration       PT Short Term Goals - 04/22/17 1139      PT SHORT TERM GOAL #1   Title  Patient will be independent in home exercise  program to improve strength/mobility for better functional independence with ADLs.    Baseline  hep given    Time  2    Period  Weeks    Status  New    Target Date  05/06/17      PT SHORT TERM GOAL #2   Title  Patient will report a worst pain of 5/10 on VAS in back   to improve tolerance with ADLs and reduced symptoms with activities.     Baseline  9/10 pain    Time  2    Period  Weeks    Status  New    Target Date  05/06/17      PT SHORT TERM GOAL #3   Title  Patient will increase BLE gross strength to 4+/5 as to improve functional strength for independent gait, increased standing tolerance and increased ADL ability.    Baseline  2+/5 due to restriction of pain in ROM    Time  2    Period  Weeks    Status  New    Target Date  05/06/17        PT Long Term Goals - 04/22/17 1142      PT LONG TERM GOAL #1   Title  Patient will increase 10 meter walk test to >1.86m/s as to improve gait speed for better community ambulation and to reduce fall risk.    Baseline  11/9: .39m/s    Time  5    Period  Weeks    Status  New    Target Date  05/27/17      PT LONG TERM GOAL #2   Title  Patient will report a worst pain of 3/10 on VAS in back to improve tolerance with ADLs and reduced symptoms with activities.     Baseline  11/9: 9/10    Time  5    Period  Weeks    Status  New    Target Date  05/27/17      PT LONG TERM GOAL #3   Title  Patient will decrease FABQ scores by 10 points (75/96) to reduce pain avoidant behavior and improve quality of life.     Baseline  11/9: FABQ 85/96: FABQPA 20, FABQW: 39    Time  5    Period  Weeks    Status  New    Target Date  05/27/17      PT LONG TERM GOAL #4   Title  Patient will reduce modified Oswestry score to <20 as to demonstrate minimal  disability with ADLs including improved sleeping tolerance, walking/sitting tolerance etc for better mobility with ADLs.     Baseline  11/9: 58%    Time  5    Period  Weeks    Status  New    Target Date   05/27/17      PT LONG TERM GOAL #5   Title  Patient (< 50 years old) will complete five times sit to stand test in < 10 seconds indicating an increased LE strength and improved balance.    Baseline  72 seconds    Time  5    Period  Weeks    Status  New    Target Date  05/27/17            Plan - 05/12/17 1159    Clinical Impression Statement  Patient presents with pain avoidance mindset. Fear of pain limits pt. Movement and anticipatory muscle contractions occur increasing pain. Patient educated on sleeping positions. Isometrics and ultrasound performed on R side to test if makes positive impact for prolonged period of time/compare side to side next session. Patient will continue to benefit form skilled PT interventions for decreased pain and improved quality of life.     Rehab Potential  Fair    Clinical Impairments Affecting Rehab Potential  saddle region affected, caregiver to mother, activity exacerbation (+) good compliance and understanding of medical field     PT Frequency  2x / week    PT Duration  6 weeks    PT Treatment/Interventions  ADLs/Self Care Home Management;Cryotherapy;Ultrasound;Traction;Moist Heat;Iontophoresis 4mg /ml Dexamethasone;Electrical Stimulation;Gait training;Stair training;Functional mobility training;Neuromuscular re-education;Balance training;Therapeutic exercise;Therapeutic activities;Patient/family education;Passive range of motion;Manual techniques;Dry needling;Energy conservation;Taping    PT Next Visit Plan  assess response to low level therapeutic exercise intervention to decide appropriate progression, ultrasound?    PT Home Exercise Plan  see sheet    Consulted and Agree with Plan of Care  Patient       Patient will benefit from skilled therapeutic intervention in order to improve the following deficits and impairments:  Abnormal gait, Decreased activity tolerance, Decreased coordination, Decreased balance, Decreased endurance, Decreased range of  motion, Decreased mobility, Decreased strength, Difficulty walking, Impaired flexibility, Impaired perceived functional ability, Increased muscle spasms, Impaired sensation, Impaired UE functional use, Postural dysfunction, Improper body mechanics, Pain  Visit Diagnosis: Acute midline low back pain, with sciatica presence unspecified  Other abnormalities of gait and mobility  Decreased range of motion of trunk and back     Problem List Patient Active Problem List   Diagnosis Date Noted  . Gastroesophageal reflux disease without esophagitis 05/31/2016  . Caregiver stress syndrome 04/04/2016  . Anxiety   . Tobacco abuse 07/14/2011   Precious BardMarina Lillyauna Jenkinson, PT, DPT   Precious BardMarina Kristene Liberati 05/12/2017, 12:01 PM  [place] [facility] [address] [place], [place], [address] Phone: [phone]   Fax:  [phone]  Name: Nichole Klein MRN: [id number] Date of Birth: 09-May-1966

## 2017-05-16 ENCOUNTER — Ambulatory Visit: Payer: PRIVATE HEALTH INSURANCE | Attending: Orthopaedic Surgery

## 2017-05-16 DIAGNOSIS — M2560 Stiffness of unspecified joint, not elsewhere classified: Secondary | ICD-10-CM | POA: Diagnosis present

## 2017-05-16 DIAGNOSIS — M545 Low back pain: Secondary | ICD-10-CM | POA: Diagnosis present

## 2017-05-16 DIAGNOSIS — M2569 Stiffness of other specified joint, not elsewhere classified: Secondary | ICD-10-CM

## 2017-05-16 DIAGNOSIS — R2689 Other abnormalities of gait and mobility: Secondary | ICD-10-CM | POA: Insufficient documentation

## 2017-05-16 NOTE — Therapy (Signed)
[place] [facility] [address] [place], [place], [address] Phone: [phone]   Fax:  [phone]  Physical Therapy Treatment  Patient Details  Name: Nichole Klein MRN: [id number] Date of Birth: 07-Aug-1965 Referring Provider: Dr. Artelia Larocheavid Musante   Encounter Date: 05/16/2017  PT End of Session - 05/16/17 1520    Visit Number  6    Number of Visits  9    Date for PT Re-Evaluation  05/27/17    PT Start Time  1430    PT Stop Time  1515    PT Time Calculation (min)  45 min    Activity Tolerance  Patient limited by pain;Treatment limited secondary to medical complications (Comment)    Behavior During Therapy  [facility] for tasks assessed/performed;Agitated;Anxious       Past Medical History:  Diagnosis Date  . Anxiety   . Arthritis    DDD lumbar  . Hematuria 06/15/2007   s/p urology consultation Assunta GamblesBrian Cope; s/p CT scan negative.  No cystoscopy.  Marland Kitchen. Herpes labialis     Past Surgical History:  Procedure Laterality Date  . NOSE SURGERY    . SPINE SURGERY  06/14/2008   Lumbar surgery L5-S1.  [place].    There were no vitals filed for this visit.  Subjective Assessment - 05/16/17 1432    Subjective  patient recieved tens unit and back brace since last session. No noticed differences side to side.     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 Felt a pop in the middle of back. This was October 22nd. Pain in the middle back and lower back, both feet and both calves pins and needles, constant. Pain going down left buttcheek, vagina is numb. X rays taken showing degenerative disc disease of back, had laminectomy 10 years ago at L5 S1. When patient lays down over heating pad it makes back feel better. Ice doesn't help at all. Was on prednisone and robaxen. Ortho doctor changed medications to flexoril and hydrocodone.     Limitations   Sitting;Reading;Lifting;Standing;Walking;Writing;House hold activities;Other (comment)    How long can you sit comfortably?  painful    How long can you stand comfortably?  painful    How long can you walk comfortably?  5 minutes    Diagnostic tests  X ray, will recieve MRI tomorrow    Patient Stated Goals  reduce pain    Currently in Pain?  Yes    Pain Score  9     Pain Location  Back    Pain Orientation  Mid;Lower    Pain Descriptors / Indicators  Aching    Pain Type  Chronic pain    Pain Onset  More than a month ago         education on donning and doffing new back brace. Performed step through education with verbal cueing and demonstration one time, had patient perform with verbal cueing one time, and an additional time completely independently.    Education on new ESTIM portable TENS unit. Educate d on placement of electrodes, placement of wires, crossing of pathway, and different modes. Required PT to perform with demonstration and verbal cues 1x, patient performed independently to set up and stop 1x.   Ambulating 90 ft with core stabilization utilizing back brace and ESTIM portable tens unit.   Sit to stand transfer, adding in donning of brace by sitting down then standing up  to fasten for tighter fit.                       PT Education - 05/16/17 1520    Education provided  Yes    Education Details  portable TENS unit, back brace donning and doffing    Person(s) Educated  Patient    Methods  Explanation;Demonstration;Verbal cues;Handout    Comprehension  Verbalized understanding;Returned demonstration       PT Short Term Goals - 04/22/17 1139      PT SHORT TERM GOAL #1   Title  Patient will be independent in home exercise program to improve strength/mobility for better functional independence with ADLs.    Baseline  hep given    Time  2    Period  Weeks    Status  New    Target Date  05/06/17      PT SHORT TERM GOAL #2   Title  Patient will  report a worst pain of 5/10 on VAS in back   to improve tolerance with ADLs and reduced symptoms with activities.     Baseline  9/10 pain    Time  2    Period  Weeks    Status  New    Target Date  05/06/17      PT SHORT TERM GOAL #3   Title  Patient will increase BLE gross strength to 4+/5 as to improve functional strength for independent gait, increased standing tolerance and increased ADL ability.    Baseline  2+/5 due to restriction of pain in ROM    Time  2    Period  Weeks    Status  New    Target Date  05/06/17        PT Long Term Goals - 04/22/17 1142      PT LONG TERM GOAL #1   Title  Patient will increase 10 meter walk test to >1.23m/s as to improve gait speed for better community ambulation and to reduce fall risk.    Baseline  11/9: .72m/s    Time  5    Period  Weeks    Status  New    Target Date  05/27/17      PT LONG TERM GOAL #2   Title  Patient will report a worst pain of 3/10 on VAS in back to improve tolerance with ADLs and reduced symptoms with activities.     Baseline  11/9: 9/10    Time  5    Period  Weeks    Status  New    Target Date  05/27/17      PT LONG TERM GOAL #3   Title  Patient will decrease FABQ scores by 10 points (75/96) to reduce pain avoidant behavior and improve quality of life.     Baseline  11/9: FABQ 85/96: FABQPA 20, FABQW: 39    Time  5    Period  Weeks    Status  New    Target Date  05/27/17      PT LONG TERM GOAL #4   Title  Patient will reduce modified Oswestry score to <20 as to demonstrate minimal disability with ADLs including improved sleeping tolerance, walking/sitting tolerance etc for better mobility with ADLs.     Baseline  11/9: 58%    Time  5    Period  Weeks    Status  New    Target Date  05/27/17  PT LONG TERM GOAL #5   Title  Patient (< 50 years old) will complete five times sit to stand test in < 10 seconds indicating an increased LE strength and improved balance.    Baseline  72 seconds    Time  5     Period  Weeks    Status  New    Target Date  05/27/17            Plan - 05/16/17 1522    Clinical Impression Statement  Patient continues to be pain dominant with no improvements noted. Fear of movement diminished upon donning of back brace with improved gait mechanics. Pt. Educated on donning /doffing back brace and ESTIM portable TENS unit. Will discuss with caseworker possibility of referral to pain clinic .    Rehab Potential  Fair    Clinical Impairments Affecting Rehab Potential  saddle region affected, caregiver to mother, activity exacerbation (+) good compliance and understanding of medical field     PT Frequency  2x / week    PT Duration  6 weeks    PT Treatment/Interventions  ADLs/Self Care Home Management;Cryotherapy;Ultrasound;Traction;Moist Heat;Iontophoresis 4mg /ml Dexamethasone;Electrical Stimulation;Gait training;Stair training;Functional mobility training;Neuromuscular re-education;Balance training;Therapeutic exercise;Therapeutic activities;Patient/family education;Passive range of motion;Manual techniques;Dry needling;Energy conservation;Taping    PT Next Visit Plan  assess response to low level therapeutic exercise intervention to decide appropriate progression, ultrasound?    PT Home Exercise Plan  see sheet    Consulted and Agree with Plan of Care  Patient       Patient will benefit from skilled therapeutic intervention in order to improve the following deficits and impairments:  Abnormal gait, Decreased activity tolerance, Decreased coordination, Decreased balance, Decreased endurance, Decreased range of motion, Decreased mobility, Decreased strength, Difficulty walking, Impaired flexibility, Impaired perceived functional ability, Increased muscle spasms, Impaired sensation, Impaired UE functional use, Postural dysfunction, Improper body mechanics, Pain  Visit Diagnosis: Acute midline low back pain, with sciatica presence unspecified  Other abnormalities of gait  and mobility  Decreased range of motion of trunk and back     Problem List Patient Active Problem List   Diagnosis Date Noted  . Gastroesophageal reflux disease without esophagitis 05/31/2016  . Caregiver stress syndrome 04/04/2016  . Anxiety   . Tobacco abuse 07/14/2011   Precious BardMarina Elizibeth Breau, PT, DPT   Precious BardMarina Kalid Ghan 05/16/2017, 3:24 PM  [facility] [address] [place], [place], [address] Phone: [phone]   Fax:  [phone]  Name: Nichole Klein MRN: [id number] Date of Birth: [birth year]

## 2017-05-18 ENCOUNTER — Ambulatory Visit: Payer: PRIVATE HEALTH INSURANCE

## 2017-05-18 DIAGNOSIS — M545 Low back pain: Secondary | ICD-10-CM

## 2017-05-18 DIAGNOSIS — M2569 Stiffness of other specified joint, not elsewhere classified: Secondary | ICD-10-CM

## 2017-05-18 DIAGNOSIS — R2689 Other abnormalities of gait and mobility: Secondary | ICD-10-CM

## 2017-05-18 DIAGNOSIS — M2560 Stiffness of unspecified joint, not elsewhere classified: Secondary | ICD-10-CM

## 2017-05-18 NOTE — Therapy (Signed)
[place] MAIN [facility] SERVICES [address] [place], [place], [address] Phone: [phone]   Fax:  [phone]  Physical Therapy Treatment  Patient Details  Name: Nichole Klein MRN: [id number] Date of Birth: [birth year] Referring Provider: Dr. Charlotte Sanes   Encounter Date: 05/18/2017  PT End of Session - 05/18/17 2136    Visit Number  7    Number of Visits  9    Date for PT Re-Evaluation  05/27/17    PT Start Time  1430    PT Stop Time  1515    PT Time Calculation (min)  45 min    Activity Tolerance  Patient limited by pain;Treatment limited secondary to medical complications (Comment)    Behavior During Therapy  Agitated;Anxious       Past Medical History:  Diagnosis Date  . Anxiety   . Arthritis    DDD lumbar  . Hematuria 06/15/2007   s/p urology consultation Edrick Oh; s/p CT scan negative.  No cystoscopy.  Marland Kitchen Herpes labialis     Past Surgical History:  Procedure Laterality Date  . NOSE SURGERY    . SPINE SURGERY  06/14/2008   Lumbar surgery L5-S1.  [place].    There were no vitals filed for this visit.  Subjective Assessment - 05/18/17 1435    Subjective  Pt. reports TENS unit has increased neurological pains in legs, Back brace is irritating pain. Pt. continues to say no pain decrease or improvement. On pain pill right now so pain is 8/10, usually 9/10.     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 Felt a pop in the middle of back. This was October 22nd. Pain in the middle back and lower back, both feet and both calves pins and needles, constant. Pain going down left buttcheek, vagina is numb. X rays taken showing degenerative disc disease of back, had laminectomy 10 years ago at L5 S1. When patient lays down over heating pad it makes back feel better. Ice doesn't help at all. Was on prednisone and robaxen. Ortho doctor changed medications to flexoril  and hydrocodone.     Limitations  Sitting;Reading;Lifting;Standing;Walking;Writing;House hold activities;Other (comment)    How long can you sit comfortably?  painful    How long can you stand comfortably?  painful    How long can you walk comfortably?  5 minutes    Diagnostic tests  X ray, will recieve MRI tomorrow    Patient Stated Goals  reduce pain    Currently in Pain?  Yes    Pain Score  8     Pain Location  Back    Pain Orientation  Mid;Lower    Pain Descriptors / Indicators  Aching    Pain Type  Chronic pain    Pain Onset  More than a month ago           Heat applied to back in seated position   5x STS= 50 seconds , patient continues to use excessive UE assistance and pushes primarily through RLE with minimal weight acceptance through LLE.    MMT: 2+/5 due to restriction of pain in ROM   FABQ15/30; 62/66; 77/96 MODI=54% 10MWT: 29 seconds =.47ms  Posture education    STM to thoracolumbar region in sidelying. Initially begin with low pressure with gradual increase with repetition, pettrissage and effleurage elements implemented for muscle relaxation.  Ambulation after STM to demonstrate improved mobility  with patient having slight increase in trunk mobility however no decrease in pain.                       PT Education - 05/18/17 2135    Education provided  Yes    Education Details  pain clinic, mind centric pain,     Person(s) Educated  Patient    Methods  Explanation    Comprehension  Verbalized understanding       PT Short Term Goals - 05/18/17 1446      PT SHORT TERM GOAL #1   Title  Patient will be independent in home exercise program to improve strength/mobility for better functional independence with ADLs.    Baseline  HEP performed    Time  2    Period  Weeks    Status  Partially Met      PT SHORT TERM GOAL #2   Title  Patient will report a worst pain of 5/10 on VAS in back   to improve tolerance with ADLs and reduced symptoms  with activities.     Baseline  9/10 pain 12/5: 9/10 pain    Time  2    Period  Weeks    Status  On-going      PT SHORT TERM GOAL #3   Title  Patient will increase BLE gross strength to 4+/5 as to improve functional strength for independent gait, increased standing tolerance and increased ADL ability.    Baseline  2+/5 due to restriction of pain in ROM 12/5: same    Time  2    Period  Weeks    Status  On-going        PT Long Term Goals - 05/18/17 1447      PT LONG TERM GOAL #1   Title  Patient will increase 10 meter walk test to >1.10ms as to improve gait speed for better community ambulation and to reduce fall risk.    Baseline  11/9: .449m 12/5: .3421m   Time  5    Period  Weeks    Status  On-going      PT LONG TERM GOAL #2   Title  Patient will report a worst pain of 3/10 on VAS in back to improve tolerance with ADLs and reduced symptoms with activities.     Baseline  11/9: 9/10 12/5: 9/10    Time  5    Period  Weeks    Status  On-going      PT LONG TERM GOAL #3   Title  Patient will decrease FABQ scores by 10 points (75/96) to reduce pain avoidant behavior and improve quality of life.     Baseline  11/9: FABQ 85/96: FABQPA 20, FABQW: 39 12/5: FABQ: 77/96;     Time  5    Period  Weeks    Status  Partially Met      PT LONG TERM GOAL #4   Title  Patient will reduce modified Oswestry score to <20 as to demonstrate minimal disability with ADLs including improved sleeping tolerance, walking/sitting tolerance etc for better mobility with ADLs.     Baseline  11/9: 58% 12/5: 54%    Time  5    Period  Weeks    Status  On-going      PT LONG TERM GOAL #5   Title  Patient (< 60 69ars old) will complete five times sit to stand test in < 10 seconds indicating an  increased LE strength and improved balance.    Baseline  72 seconds 12/5: 50 seconds with excessive UE assistance    Time  5    Period  Weeks    Status  Partially Met            Plan - 05/18/17 2139     Clinical Impression Statement  Patient's pain avoidant and fear of pain mindset limits all movements of thoracolumbar spine as pt. Tightens supporting musculature creating a guarding tension that increase pain creating a cycle of fear of movement and limited movement. Patient would benefit from referral to pain clinic as pain is becoming more cognitive based. Patient educated on the relationship between pain and our thoughts during a previous session by utilizing a video and repeated throughout subsequent sessions through verbal conversation. Patient based outcomes demonstrate high percieved disability with FABQ= 77/96; and MODI= 54%. Will converse with case worker about other options as pain has not been alleviated in sessions.  Recommend patient returns to doctor and receive referral to pain clinic.     Rehab Potential  Fair    Clinical Impairments Affecting Rehab Potential  saddle region affected, caregiver to mother, activity exacerbation (+) good compliance and understanding of medical field     PT Frequency  2x / week    PT Duration  6 weeks    PT Treatment/Interventions  ADLs/Self Care Home Management;Cryotherapy;Ultrasound;Traction;Moist Heat;Iontophoresis 62m/ml Dexamethasone;Electrical Stimulation;Gait training;Stair training;Functional mobility training;Neuromuscular re-education;Balance training;Therapeutic exercise;Therapeutic activities;Patient/family education;Passive range of motion;Manual techniques;Dry needling;Energy conservation;Taping    PT Next Visit Plan  pain clinic?     PT Home Exercise Plan  see sheet    Consulted and Agree with Plan of Care  Patient       Patient will benefit from skilled therapeutic intervention in order to improve the following deficits and impairments:  Abnormal gait, Decreased activity tolerance, Decreased coordination, Decreased balance, Decreased endurance, Decreased range of motion, Decreased mobility, Decreased strength, Difficulty walking, Impaired  flexibility, Impaired perceived functional ability, Increased muscle spasms, Impaired sensation, Impaired UE functional use, Postural dysfunction, Improper body mechanics, Pain  Visit Diagnosis: Acute midline low back pain, with sciatica presence unspecified  Other abnormalities of gait and mobility  Decreased range of motion of trunk and back     Problem List Patient Active Problem List   Diagnosis Date Noted  . Gastroesophageal reflux disease without esophagitis 05/31/2016  . Caregiver stress syndrome 04/04/2016  . Anxiety   . Tobacco abuse 07/14/2011   MJanna Arch PT, DPT   MJanna Arch12/10/2016, 9:43 PM  [place] [facility] IncSERVICES [address] [place] [address]Phone: [phone]  Fax:  [phone] Name: RMileah HemmerMRN: [id number] of Birth: [birth year]

## 2017-05-24 ENCOUNTER — Ambulatory Visit: Payer: PRIVATE HEALTH INSURANCE

## 2017-05-24 DIAGNOSIS — M545 Low back pain: Secondary | ICD-10-CM

## 2017-05-24 DIAGNOSIS — R2689 Other abnormalities of gait and mobility: Secondary | ICD-10-CM

## 2017-05-24 DIAGNOSIS — M2569 Stiffness of other specified joint, not elsewhere classified: Secondary | ICD-10-CM

## 2017-05-24 DIAGNOSIS — M2560 Stiffness of unspecified joint, not elsewhere classified: Secondary | ICD-10-CM

## 2017-05-24 NOTE — Therapy (Signed)
[place] MAIN [facility] SERVICES [address] [place], [place], [address] Phone: [phone]   Fax:  [phone]  Physical Therapy Treatment  Patient Details  Name: Nichole Klein MRN: [id number] Date of Birth: 1966-04-23 Referring Provider: Dr. Charlotte Sanes   Encounter Date: 05/24/2017  PT End of Session - 05/24/17 1426    Visit Number  8    Number of Visits  9    Date for PT Re-Evaluation  05/27/17    PT Start Time  1400    PT Stop Time  1445    PT Time Calculation (min)  45 min    Activity Tolerance  Patient limited by pain;Treatment limited secondary to medical complications (Comment)    Behavior During Therapy  Agitated;Anxious       Past Medical History:  Diagnosis Date  . Anxiety   . Arthritis    DDD lumbar  . Hematuria 06/15/2007   s/p urology consultation Edrick Oh; s/p CT scan negative.  No cystoscopy.  Marland Kitchen Herpes labialis     Past Surgical History:  Procedure Laterality Date  . NOSE SURGERY    . SPINE SURGERY  06/14/2008   Lumbar surgery L5-S1.  [place].    There were no vitals filed for this visit.  Subjective Assessment - 05/24/17 1401    Subjective  Pt. had injection on Friday, no success, still in pain and limited. Stopped using TENS unit after talking with Case Worker, Pt. c/o brace is heavy and hard to wear but is wearing it for laundry.     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 Felt a pop in the middle of back. This was October 22nd. Pain in the middle back and lower back, both feet and both calves pins and needles, constant. Pain going down left buttcheek, vagina is numb. X rays taken showing degenerative disc disease of back, had laminectomy 10 years ago at L5 S1. When patient lays down over heating pad it makes back feel better. Ice doesn't help at all. Was on prednisone and robaxen. Ortho doctor changed medications to flexoril and  hydrocodone.     Limitations  Sitting;Reading;Lifting;Standing;Walking;Writing;House hold activities;Other (comment)    How long can you sit comfortably?  painful    How long can you stand comfortably?  painful    How long can you walk comfortably?  5 minutes    Diagnostic tests  X ray, will recieve MRI tomorrow    Patient Stated Goals  reduce pain    Currently in Pain?  Yes    Pain Score  9     Pain Location  Back    Pain Orientation  Mid;Lower    Pain Descriptors / Indicators  Aching    Pain Type  Chronic pain    Pain Onset  More than a month ago    Pain Frequency  Constant     Manual:  Hamstring stretch , IR, and ER 60 seconds each leg, initial fighting due to fear of pain, then relax and increase in mobility, limited by pain in all ranges  LE thoracic rotations in supine with PT guiding 2x 40 second holds each side  STM to thoracolumber musculature.   TherEx Posterior pelvic tilts 15x Supine march with legs over bolster 10x each leg , cues for TrA contraction ( about 1 inch raise) SAQ supine with legs over bolster 2x15 Green theraband abduction supine  PT Education - 05/24/17 1426    Education provided  Yes    Education Details  strengthening LE's     Person(s) Educated  Patient    Methods  Explanation;Demonstration;Verbal cues    Comprehension  Verbalized understanding;Returned demonstration       PT Short Term Goals - 05/18/17 1446      PT SHORT TERM GOAL #1   Title  Patient will be independent in home exercise program to improve strength/mobility for better functional independence with ADLs.    Baseline  HEP performed    Time  2    Period  Weeks    Status  Partially Met      PT SHORT TERM GOAL #2   Title  Patient will report a worst pain of 5/10 on VAS in back   to improve tolerance with ADLs and reduced symptoms with activities.     Baseline  9/10 pain 12/5: 9/10 pain    Time  2    Period  Weeks    Status   On-going      PT SHORT TERM GOAL #3   Title  Patient will increase BLE gross strength to 4+/5 as to improve functional strength for independent gait, increased standing tolerance and increased ADL ability.    Baseline  2+/5 due to restriction of pain in ROM 12/5: same    Time  2    Period  Weeks    Status  On-going        PT Long Term Goals - 05/18/17 1447      PT LONG TERM GOAL #1   Title  Patient will increase 10 meter walk test to >1.30ms as to improve gait speed for better community ambulation and to reduce fall risk.    Baseline  11/9: .43m 12/5: .3466m   Time  5    Period  Weeks    Status  On-going      PT LONG TERM GOAL #2   Title  Patient will report a worst pain of 3/10 on VAS in back to improve tolerance with ADLs and reduced symptoms with activities.     Baseline  11/9: 9/10 12/5: 9/10    Time  5    Period  Weeks    Status  On-going      PT LONG TERM GOAL #3   Title  Patient will decrease FABQ scores by 10 points (75/96) to reduce pain avoidant behavior and improve quality of life.     Baseline  11/9: FABQ 85/96: FABQPA 20, FABQW: 39 12/5: FABQ: 77/96;     Time  5    Period  Weeks    Status  Partially Met      PT LONG TERM GOAL #4   Title  Patient will reduce modified Oswestry score to <20 as to demonstrate minimal disability with ADLs including improved sleeping tolerance, walking/sitting tolerance etc for better mobility with ADLs.     Baseline  11/9: 58% 12/5: 54%    Time  5    Period  Weeks    Status  On-going      PT LONG TERM GOAL #5   Title  Patient (< 60 76ars old) will complete five times sit to stand test in < 10 seconds indicating an increased LE strength and improved balance.    Baseline  72 seconds 12/5: 50 seconds with excessive UE assistance    Time  5    Period  Weeks    Status  Partially Met            Plan - 05/24/17 1431    Clinical Impression Statement  Patient fear of movement limits ability to perform functional movement.  When distracted from pain patient performed better mobility due to distraction from cycle of fear based movement. Patient will benefit from referral to pain clinic.     Rehab Potential  Fair    Clinical Impairments Affecting Rehab Potential  saddle region affected, caregiver to mother, activity exacerbation (+) good compliance and understanding of medical field     PT Frequency  2x / week    PT Duration  6 weeks    PT Treatment/Interventions  ADLs/Self Care Home Management;Cryotherapy;Ultrasound;Traction;Moist Heat;Iontophoresis 78m/ml Dexamethasone;Electrical Stimulation;Gait training;Stair training;Functional mobility training;Neuromuscular re-education;Balance training;Therapeutic exercise;Therapeutic activities;Patient/family education;Passive range of motion;Manual techniques;Dry needling;Energy conservation;Taping    PT Next Visit Plan  pain clinic?     PT Home Exercise Plan  see sheet    Consulted and Agree with Plan of Care  Patient       Patient will benefit from skilled therapeutic intervention in order to improve the following deficits and impairments:  Abnormal gait, Decreased activity tolerance, Decreased coordination, Decreased balance, Decreased endurance, Decreased range of motion, Decreased mobility, Decreased strength, Difficulty walking, Impaired flexibility, Impaired perceived functional ability, Increased muscle spasms, Impaired sensation, Impaired UE functional use, Postural dysfunction, Improper body mechanics, Pain  Visit Diagnosis: Acute midline low back pain, with sciatica presence unspecified  Other abnormalities of gait and mobility  Decreased range of motion of trunk and back     Problem List Patient Active Problem List   Diagnosis Date Noted  . Gastroesophageal reflux disease without esophagitis 05/31/2016  . Caregiver stress syndrome 04/04/2016  . Anxiety   . Tobacco abuse 07/14/2011   MJanna Arch PT, DPT   MJanna Arch12/04/2017, 2:47 PM  [place] [facility] [address] [place] [address]Phone: [phone]  Fax:  [phone] Name: RDafne NieldMRN: [id number] of Birth: [birth year]

## 2017-05-25 ENCOUNTER — Telehealth: Payer: Self-pay | Admitting: Family Medicine

## 2017-05-25 NOTE — Telephone Encounter (Signed)
Copied from CRM [id number]. Topic: Inquiry >> May 25, 2017 12:51 PM Stephannie LiSimmons, Janett L, NT wrote: Reason for CRM: Was texted by Dr Katrinka BlazingSmith on Sunday  and told paperwork was completed ,and she would then fax and patient received a call from disability asking to reach out to PCP ,due time limits They have not received paperwork please advise , call [phone] to update patient   >> May 25, 2017 12:58 PM Janace HoardHinson, Shannon R wrote: This is probably due to the snow and the fact that Dr. Michaelle CopasSmith's first day back is today

## 2017-05-25 NOTE — Telephone Encounter (Signed)
Do we have these forms---if so I need them before they are faxed.

## 2017-05-26 ENCOUNTER — Ambulatory Visit: Payer: PRIVATE HEALTH INSURANCE

## 2017-05-26 DIAGNOSIS — M545 Low back pain: Secondary | ICD-10-CM | POA: Diagnosis not present

## 2017-05-26 DIAGNOSIS — M2569 Stiffness of other specified joint, not elsewhere classified: Secondary | ICD-10-CM

## 2017-05-26 DIAGNOSIS — M2560 Stiffness of unspecified joint, not elsewhere classified: Secondary | ICD-10-CM

## 2017-05-26 DIAGNOSIS — R2689 Other abnormalities of gait and mobility: Secondary | ICD-10-CM

## 2017-05-26 NOTE — Therapy (Signed)
[place] MAIN [facility] SERVICES [address] [place], [place], [address] Phone: [phone]   Fax:  [phone]  Physical Therapy Treatment  Patient Details  Name: Nichole Klein MRN: [id number] Date of Birth: 1965-11-25 Referring Provider: Dr. Charlotte Sanes   Encounter Date: 05/26/2017  PT End of Session - 05/26/17 1403    Visit Number  9    Number of Visits  17    Date for PT Re-Evaluation  07/22/17    PT Start Time  4401    PT Stop Time  1430    PT Time Calculation (min)  45 min    Activity Tolerance  Patient limited by pain;Treatment limited secondary to medical complications (Comment)    Behavior During Therapy  Agitated;Anxious       Past Medical History:  Diagnosis Date  . Anxiety   . Arthritis    DDD lumbar  . Hematuria 06/15/2007   s/p urology consultation Edrick Oh; s/p CT scan negative.  No cystoscopy.  Marland Kitchen Herpes labialis     Past Surgical History:  Procedure Laterality Date  . NOSE SURGERY    . SPINE SURGERY  06/14/2008   Lumbar surgery L5-S1.  [place].    There were no vitals filed for this visit.  Subjective Assessment - 05/26/17 1359    Subjective  Patient reports no improvement of symptoms since injections last week. Still not using TENS unit at home since talking with case worker. Brace is heavy to wear and uncomfortable but patient reports wearing it for household tasks. No change in pain unless taking a pain pill with pain decreasing to 8/10.     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 Felt a pop in the middle of back. This was October 22nd. Pain in the middle back and lower back, both feet and both calves pins and needles, constant. Pain going down left buttcheek, vagina is numb. X rays taken showing degenerative disc disease of back, had laminectomy 10 years ago at L5 S1. When patient lays down over heating pad it makes back feel  better. Ice doesn't help at all. Was on prednisone and robaxen. Ortho doctor changed medications to flexoril and hydrocodone.     Limitations  Sitting;Reading;Lifting;Standing;Walking;Writing;House hold activities;Other (comment)    How long can you sit comfortably?  painful    How long can you stand comfortably?  painful    How long can you walk comfortably?  5 minutes    Diagnostic tests  X ray, will recieve MRI tomorrow    Patient Stated Goals  reduce pain    Currently in Pain?  Yes    Pain Score  9     Pain Location  Back    Pain Orientation  Mid;Lower    Pain Descriptors / Indicators  Aching    Pain Type  Chronic pain    Pain Onset  More than a month ago    Pain Frequency  Constant          VAS: 9/10 with medication 8/10  BLE strength 10MWT: 18 seconds=.56 m/s  FABQ= 78/96; FABQPA: 16 positive for fearful of physical activity, FABQW: 31 patient needs close monitoring.  MODi= 60% 5x STS= 45 seconds, push up with R hand from handrest and L hand from knee   Supine to sit transfers x 2 Seated posture training  STM to paraspinal thoracolumbar region implemented effluerage and pettrisage techniques  Patient receptive to pool therapy.  Educated on pool therapy.     Patient would benefit from pool therapy for reduction of back pain to allow for increased ROM and strengthening in functional movement patterns that are currently restricted in land based therapy due to excessive pain and fear of pain mindset.                 PT Education - 05/26/17 1401    Education provided  Yes    Education Details  aquatic therapy , POC,  connection between brain and pain signals, fear of pain mindset and circle of muscle contraction to guard causing increased pain.     Person(s) Educated  Patient    Methods  Explanation;Demonstration;Verbal cues    Comprehension  Verbalized understanding;Returned demonstration       PT Short Term Goals - 05/26/17 1733      PT SHORT  TERM GOAL #1   Title  Patient will be independent in home exercise program to improve strength/mobility for better functional independence with ADLs.    Baseline  HEP performed    Time  2    Period  Weeks    Status  Partially Met      PT SHORT TERM GOAL #2   Title  Patient will report a worst pain of 5/10 on VAS in back   to improve tolerance with ADLs and reduced symptoms with activities.     Baseline  9/10 pain 12/5: 9/10 pain 12/13: 9/10 pain    Time  2    Period  Weeks    Status  On-going      PT SHORT TERM GOAL #3   Title  Patient will increase BLE gross strength to 4+/5 as to improve functional strength for independent gait, increased standing tolerance and increased ADL ability.    Baseline  2+/5 due to restriction of pain in ROM 12/5: same 12/13: same     Time  2    Period  Weeks    Status  On-going        PT Long Term Goals - 05/26/17 1734      PT LONG TERM GOAL #1   Title  Patient will increase 10 meter walk test to >1.64ms as to improve gait speed for better community ambulation and to reduce fall risk.    Baseline  11/9: .48m 12/5: .3456m12/13: .56 m/s    Time  8    Period  Weeks    Status  On-going    Target Date  07/22/17      PT LONG TERM GOAL #2   Title  Patient will report a worst pain of 3/10 on VAS in back to improve tolerance with ADLs and reduced symptoms with activities.     Baseline  11/9: 9/10 12/5: 9/10 12/13: 9/10    Time  8    Period  Weeks    Status  On-going    Target Date  07/22/17      PT LONG TERM GOAL #3   Title  Patient will decrease FABQ scores by 10 points (75/96) to reduce pain avoidant behavior and improve quality of life.     Baseline  11/9: FABQ 85/96: FABQPA 20, FABQW: 39 12/5: FABQ: 77/96; 12/13: 78/96 FABQPA: 16 FABQW: 31    Time  8    Period  Weeks    Status  Partially Met    Target Date  07/22/17      PT LONG TERM GOAL #  4   Title  Patient will reduce modified Oswestry score to <20 as to demonstrate minimal disability  with ADLs including improved sleeping tolerance, walking/sitting tolerance etc for better mobility with ADLs.     Baseline  11/9: 58% 12/5: 54% 12/13: 60 %    Time  8    Period  Weeks    Status  On-going    Target Date  07/22/17      PT LONG TERM GOAL #5   Title  Patient (< 52 years old) will complete five times sit to stand test in < 10 seconds indicating an increased LE strength and improved balance.    Baseline  72 seconds 12/5: 50 seconds with excessive UE assistance 12/13: 45 seconds pushing up with R hand from handrest and L hand from knee    Time  8    Period  Weeks    Status  Partially Met    Target Date  07/22/17            Plan - 05/26/17 1404    Clinical Impression Statement  Patient would benefit from pool therapy for reduction of back pain to allow for increased ROM and strengthening in functional movement patterns that are currently restricted in land based therapy due to excessive pain and fear of pain mindset. Patient score of FABQW of 31 indicates patient needs close monitoring, FABQPA=16 indicating fear of physical activity and FABQ total was 78/96. MODI increased to 60% perceived disability, increasing from initial evaluation from severe disability to extreme perceived disability. 5x STS improving to 45 seconds with patient requiring heavily on UE support with pushing up from R hand on handrest and L hand from knee. 10 MWT= .56 m/s with severe antalgic gait pattern, decreased trunk and UE rotation/swing and decreased step length. Patient would benefit from referral to pain clinic due to fear based mindset and no changes in pain rating via VAS. Patient would benefit from pool therapy for reduction of back pain and improved mobility for carryover to land based activities. Patient will continue to benefit from skilled physical therapy to decrease back pain, improve mobility, and improve patient QOL.     History and Personal Factors relevant to plan of care:  Patient presents  with 3 personal factors/comorbidities/ 4 body elements including body structures and functions, activity limitations, or participation restrictions.     Clinical Presentation  Unstable    Clinical Presentation due to:  neurological radiating symptoms to saddle region and LE's, fear based mindset    Clinical Decision Making  High    Rehab Potential  Fair    Clinical Impairments Affecting Rehab Potential  saddle region affected, caregiver to mother, activity exacerbation (+) good compliance and understanding of medical field     PT Frequency  1x / week    PT Duration  8 weeks    PT Treatment/Interventions  ADLs/Self Care Home Management;Cryotherapy;Ultrasound;Traction;Moist Heat;Iontophoresis 66m/ml Dexamethasone;Electrical Stimulation;Gait training;Stair training;Functional mobility training;Neuromuscular re-education;Balance training;Therapeutic exercise;Therapeutic activities;Patient/family education;Passive range of motion;Manual techniques;Dry needling;Energy conservation;Taping;Aquatic Therapy;Fluidtherapy    PT Next Visit Plan  pain clinic, aquatic therapy     PT Home Exercise Plan  see sheet    Recommended Other Services  aquatic PT , pain clinic,     Consulted and Agree with Plan of Care  Patient       Patient will benefit from skilled therapeutic intervention in order to improve the following deficits and impairments:  Abnormal gait, Decreased activity tolerance, Decreased coordination, Decreased balance, Decreased endurance,  Decreased range of motion, Decreased mobility, Decreased strength, Difficulty walking, Impaired flexibility, Impaired perceived functional ability, Increased muscle spasms, Impaired sensation, Impaired UE functional use, Postural dysfunction, Improper body mechanics, Pain  Visit Diagnosis: Acute midline low back pain, with sciatica presence unspecified  Other abnormalities of gait and mobility  Decreased range of motion of trunk and back     Problem  List Patient Active Problem List   Diagnosis Date Noted  . Gastroesophageal reflux disease without esophagitis 05/31/2016  . Caregiver stress syndrome 04/04/2016  . Anxiety   . Tobacco abuse 07/14/2011  Janna Arch, PT, DPT    Janna Arch 05/26/2017, 5:49 PM  [place] MAIN [facility] SERVICES [address] [place], [place], [address] Phone: [phone]   Fax:  [phone]  Name: Nichole Klein MRN: [id number] Date of Birth: 02-17-1966

## 2017-05-26 NOTE — Telephone Encounter (Signed)
FMLA and disability forms were scanned and faxed on 05/26/17

## 2017-05-31 ENCOUNTER — Ambulatory Visit: Payer: PRIVATE HEALTH INSURANCE

## 2017-05-31 DIAGNOSIS — M2560 Stiffness of unspecified joint, not elsewhere classified: Secondary | ICD-10-CM

## 2017-05-31 DIAGNOSIS — M2569 Stiffness of other specified joint, not elsewhere classified: Secondary | ICD-10-CM

## 2017-05-31 DIAGNOSIS — R2689 Other abnormalities of gait and mobility: Secondary | ICD-10-CM

## 2017-05-31 DIAGNOSIS — M545 Low back pain: Secondary | ICD-10-CM

## 2017-05-31 NOTE — Therapy (Signed)
[place] MAIN [facility] SERVICES [address] [place], [place], [address] Phone: [phone]   Fax:  [phone]  Physical Therapy Treatment  Patient Details  Name: Nichole Klein MRN: [id number] Date of Birth: [birth year] Referring Provider: Dr. Charlotte Sanes   Encounter Date: 05/31/2017  PT End of Session - 05/31/17 1405    Visit Number  10    Number of Visits  17    Date for PT Re-Evaluation  07/22/17    PT Start Time  1120    PT Stop Time  1210    PT Time Calculation (min)  50 min       Past Medical History:  Diagnosis Date  . Anxiety   . Arthritis    DDD lumbar  . Hematuria 06/15/2007   s/p urology consultation Edrick Oh; s/p CT scan negative.  No cystoscopy.  Marland Kitchen Herpes labialis     Past Surgical History:  Procedure Laterality Date  . NOSE SURGERY    . SPINE SURGERY  06/14/2008   Lumbar surgery L5-S1.  [place].    There were no vitals filed for this visit.  Subjective Assessment - 05/31/17 1403    Subjective  Pt reports 8/10 pain across low and mid back currently; pain also radiates into left buttock. Also notes pain/tingling in B distal LEs and feet    Pertinent History  Patient was helping an obese patient into a bathroom on a wheelchair, fell into back of wheelchair. Felt a pop in the middle of back. This was October 22nd. Pain in the middle back and lower back, both feet and both calves pins and needles, constant. Pain going down left buttcheek, vagina is numb. X rays taken showing degenerative disc disease of back, had laminectomy 10 years ago at L5 S1. When patient lays down over heating pad it makes back feel better. Ice doesn't help at all. Was on prednisone and robaxen. Ortho doctor changed medications to flexoril and hydrocodone.       Enters/exits pool via ramp Participates in the following  Ambulation with good posture with kick boards to support UEs - 2 L fwd - 2 L side  Core stabilization with UE work,  shoulders out of water/limited range - sh flex/ext - sh abd/add - sh horiz abd/add - green dumbbell triceps pressdown  Bench seated work - single side bicycle x 2 min each with core stab  Hot tub x 10 min with seated single side long sit ham stretch and gastroc stretching. 3 x 30 seconds each                        PT Education - 05/31/17 1404    Education provided  Yes    Education Details  core stabilization seated and stand; use of heat versus cold; hamstrings/gastroc stretching;     Person(s) Educated  Patient       PT Short Term Goals - 05/31/17 1413      PT SHORT TERM GOAL #1   Title  Patient will be independent in home exercise program to improve strength/mobility for better functional independence with ADLs.    Baseline  HEP performed    Time  2    Period  Weeks    Status  Partially Met      PT SHORT TERM GOAL #2   Title  Patient will report a worst pain of 5/10 on VAS in back   to improve tolerance with ADLs  and reduced symptoms with activities.     Baseline  9/10 pain 12/5: 9/10 pain 12/13: 9/10 pain    Time  2    Period  Weeks    Status  On-going      PT SHORT TERM GOAL #3   Title  Patient will increase BLE gross strength to 4+/5 as to improve functional strength for independent gait, increased standing tolerance and increased ADL ability.    Baseline  2+/5 due to restriction of pain in ROM 12/5: same 12/13: same     Time  2    Period  Weeks    Status  On-going        PT Long Term Goals - 05/31/17 1414      PT LONG TERM GOAL #1   Title  Patient will increase 10 meter walk test to >1.84ms as to improve gait speed for better community ambulation and to reduce fall risk.    Baseline  11/9: .425m 12/5: .3447m12/13: .56 m/s    Time  8    Period  Weeks    Status  On-going      PT LONG TERM GOAL #2   Title  Patient will report a worst pain of 3/10 on VAS in back to improve tolerance with ADLs and reduced symptoms with activities.      Baseline  11/9: 9/10 12/5: 9/10 12/13: 9/10    Time  8    Period  Weeks    Status  On-going      PT LONG TERM GOAL #3   Title  Patient will decrease FABQ scores by 10 points (75/96) to reduce pain avoidant behavior and improve quality of life.     Baseline  11/9: FABQ 85/96: FABQPA 20, FABQW: 39 12/5: FABQ: 77/96; 12/13: 78/96 FABQPA: 16 FABQW: 31    Time  8    Period  Weeks    Status  Partially Met      PT LONG TERM GOAL #4   Title  Patient will reduce modified Oswestry score to <20 as to demonstrate minimal disability with ADLs including improved sleeping tolerance, walking/sitting tolerance etc for better mobility with ADLs.     Baseline  11/9: 58% 12/5: 54% 12/13: 60 %    Time  8    Period  Weeks    Status  On-going      PT LONG TERM GOAL #5   Title  Patient (< 60 31ars old) will complete five times sit to stand test in < 10 seconds indicating an increased LE strength and improved balance.    Baseline  72 seconds 12/5: 50 seconds with excessive UE assistance 12/13: 45 seconds pushing up with R hand from handrest and L hand from knee    Time  8    Period  Weeks    Status  Partially Met            Plan - 05/31/17 1406    Clinical Impression Statement  Pt notes not an increase in pain in water, but a different pressure to her back describing the bouancy and adhessive/cohesive properties of water; initiallly a "strange" sensation, but does not adversely affect pt. Pt moves very slow and guarded throughout session. Session quite conservative and encouraged pt to pay close attention today and tomorrow to response. Pt moves painstakingly slow through water with LEs (ie 6.5 and 5.5 R/L respectively single performed seated limited range bicycle movement. Pt at a very low starting point; hopeful pt  tolerates without adverse affect and can progress gradually from this point.     Rehab Potential  Fair    Clinical Impairments Affecting Rehab Potential  saddle region affected, caregiver to  mother, activity exacerbation (+) good compliance and understanding of medical field     PT Frequency  1x / week    PT Duration  8 weeks    PT Treatment/Interventions  ADLs/Self Care Home Management;Cryotherapy;Ultrasound;Traction;Moist Heat;Iontophoresis 28m/ml Dexamethasone;Electrical Stimulation;Gait training;Stair training;Functional mobility training;Neuromuscular re-education;Balance training;Therapeutic exercise;Therapeutic activities;Patient/family education;Passive range of motion;Manual techniques;Dry needling;Energy conservation;Taping;Aquatic Therapy;Fluidtherapy    PT Next Visit Plan  pain clinic, aquatic therapy     PT Home Exercise Plan  see sheet    Consulted and Agree with Plan of Care  Patient       Patient will benefit from skilled therapeutic intervention in order to improve the following deficits and impairments:  Abnormal gait, Decreased activity tolerance, Decreased coordination, Decreased balance, Decreased endurance, Decreased range of motion, Decreased mobility, Decreased strength, Difficulty walking, Impaired flexibility, Impaired perceived functional ability, Increased muscle spasms, Impaired sensation, Impaired UE functional use, Postural dysfunction, Improper body mechanics, Pain  Visit Diagnosis: Acute midline low back pain, with sciatica presence unspecified  Other abnormalities of gait and mobility  Decreased range of motion of trunk and back     Problem List Patient Active Problem List   Diagnosis Date Noted  . Gastroesophageal reflux disease without esophagitis 05/31/2016  . Caregiver stress syndrome 04/04/2016  . Anxiety   . Tobacco abuse 07/14/2011    HLarae Grooms12/18/2018, 2:14 PM  [place] [facility] [address] [address]Phone: [phone]  Fax:  [phone] Name: Nichole IgeMRN: [id number] of Birth: 402-08-1965

## 2017-06-03 ENCOUNTER — Telehealth: Payer: Self-pay | Admitting: Family Medicine

## 2017-06-03 NOTE — Telephone Encounter (Signed)
I will send the CRM to Dr Katrinka BlazingSmith and see what she says about the paitents FMLA---I do not know that we can extend this without seeing the patient. Also if the Workers Comp Dr is the one taking her out of work he should be the one that does the paper work.  Dr Katrinka BlazingSmith what would you like to do about this patients paperwork? She has not been seen since 05-12-27 and we are not the ones that took her out of work.  Please let me know what to do with her paperwork. Thank you

## 2017-06-03 NOTE — Telephone Encounter (Signed)
Copied from CRM [phone]. Topic: General - Other >> Jun 02, 2017 11:44 AM Jolayne Hainesaylor, Brittany L wrote: Pt needs her FMLA papers extended bc the papers say that she should have seen improvement by 06/15/17. Dr Katrinka BlazingSmith does not have any open appts until late jan. Patient states she is not feeling any better. She is still being seen by the workers comp doctor. He told her if she wasn't better within the next few months then she would be on permanent restrictions. Patient states she does not have any insurance right now bc [facility] voluntary gave up her job & that is not true. She has been to a Clinical research associatelawyer. She said she will pay OOP if she needs to. She said her lawyer has advised her to talk with Dr Katrinka BlazingSmith about this. He thinks that she needs to have this amended for an extended date. Please call back @ [phone]

## 2017-06-09 ENCOUNTER — Ambulatory Visit: Payer: PRIVATE HEALTH INSURANCE

## 2017-06-09 DIAGNOSIS — M545 Low back pain: Secondary | ICD-10-CM | POA: Diagnosis not present

## 2017-06-09 DIAGNOSIS — M2560 Stiffness of unspecified joint, not elsewhere classified: Secondary | ICD-10-CM

## 2017-06-09 DIAGNOSIS — M2569 Stiffness of other specified joint, not elsewhere classified: Secondary | ICD-10-CM

## 2017-06-09 DIAGNOSIS — R2689 Other abnormalities of gait and mobility: Secondary | ICD-10-CM

## 2017-06-09 NOTE — Therapy (Signed)
[place] MAIN [facility] SERVICES [address] [place], [place], [address] Phone: [phone]   Fax:  [phone]  Physical Therapy Treatment  Patient Details  Name: Nichole Klein MRN: [id number] Date of Birth: March 22, 1966 Referring Provider: Dr. Charlotte Sanes   Encounter Date: 06/09/2017  PT End of Session - 06/09/17 1329    Visit Number  11    Number of Visits  17    Date for PT Re-Evaluation  07/22/17    PT Start Time  1030    PT Stop Time  1100    PT Time Calculation (min)  30 min    Activity Tolerance  Patient limited by pain    Behavior During Therapy  [facility] for tasks assessed/performed       Past Medical History:  Diagnosis Date  . Anxiety   . Arthritis    DDD lumbar  . Hematuria 06/15/2007   s/p urology consultation Edrick Oh; s/p CT scan negative.  No cystoscopy.  Marland Kitchen Herpes labialis     Past Surgical History:  Procedure Laterality Date  . NOSE SURGERY    . SPINE SURGERY  06/14/2008   Lumbar surgery L5-S1.  [place].    There were no vitals filed for this visit.  Subjective Assessment - 06/09/17 1327    Subjective  Continues with 8/10 pain mid/low back into L buttock and tingling sensation in B calves/feet. Pt notes worsening compression sensation in LB and buttock that worsens symptoms with submergence of hips into water particularly in a standing position.     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 Felt a pop in the middle of back. This was October 22nd. Pain in the middle back and lower back, both feet and both calves pins and needles, constant. Pain going down left buttcheek, vagina is numb. X rays taken showing degenerative disc disease of back, had laminectomy 10 years ago at L5 S1. When patient lays down over heating pad it makes back feel better. Ice doesn't help at all. Was on prednisone and robaxen. Ortho doctor changed medications to  flexoril and hydrocodone.       Pt enters/exits pool via ramp Participates in the following  Ambulation with kick boards for UE support/posture - 4 L fwd - 2 L side leading R  Bench for core stabilization, 2 min ea with rest as needed - bike  - scissor  - flutter  Stretching, seated, 3 x 30 sec - hams - gastrocs  Unable to tolerate any further stand exercises                         PT Short Term Goals - 06/09/17 1333      PT SHORT TERM GOAL #1   Title  Patient will be independent in home exercise program to improve strength/mobility for better functional independence with ADLs.    Baseline  HEP performed    Time  2    Period  Weeks    Status  Partially Met      PT SHORT TERM GOAL #2   Title  Patient will report a worst pain of 5/10 on VAS in back   to improve tolerance with ADLs and reduced symptoms with activities.     Baseline  9/10 pain 12/5: 9/10 pain 12/13: 9/10 pain    Time  2    Period  Weeks  Status  On-going      PT SHORT TERM GOAL #3   Title  Patient will increase BLE gross strength to 4+/5 as to improve functional strength for independent gait, increased standing tolerance and increased ADL ability.    Baseline  2+/5 due to restriction of pain in ROM 12/5: same 12/13: same     Time  2    Period  Weeks    Status  On-going        PT Long Term Goals - 06/09/17 1333      PT LONG TERM GOAL #1   Title  Patient will increase 10 meter walk test to >1.50ms as to improve gait speed for better community ambulation and to reduce fall risk.    Baseline  11/9: .44m 12/5: .3463m12/13: .56 m/s    Time  8    Period  Weeks    Status  On-going      PT LONG TERM GOAL #2   Title  Patient will report a worst pain of 3/10 on VAS in back to improve tolerance with ADLs and reduced symptoms with activities.     Baseline  11/9: 9/10 12/5: 9/10 12/13: 9/10    Time  8    Period  Weeks    Status  On-going      PT LONG TERM GOAL #3   Title   Patient will decrease FABQ scores by 10 points (75/96) to reduce pain avoidant behavior and improve quality of life.     Baseline  11/9: FABQ 85/96: FABQPA 20, FABQW: 39 12/5: FABQ: 77/96; 12/13: 78/96 FABQPA: 16 FABQW: 31    Time  8    Period  Weeks    Status  Partially Met      PT LONG TERM GOAL #4   Title  Patient will reduce modified Oswestry score to <20 as to demonstrate minimal disability with ADLs including improved sleeping tolerance, walking/sitting tolerance etc for better mobility with ADLs.     Baseline  11/9: 58% 12/5: 54% 12/13: 60 %    Time  8    Period  Weeks    Status  On-going      PT LONG TERM GOAL #5   Title  Patient (< 60 12ars old) will complete five times sit to stand test in < 10 seconds indicating an increased LE strength and improved balance.    Baseline  72 seconds 12/5: 50 seconds with excessive UE assistance 12/13: 45 seconds pushing up with R hand from handrest and L hand from knee    Time  8    Period  Weeks    Status  Partially Met            Plan - 06/09/17 1330    Clinical Impression Statement  Pt feels adversely affected by the increase in pressure on her LB today while in the water particularly in stand position. Pt did not tolerate session well; therefore shortened session and will discuss future visits or discontinuation of aquatic therapy with the primary therapist.    Rehab Potential  Fair    Clinical Impairments Affecting Rehab Potential  saddle region affected, caregiver to mother, activity exacerbation (+) good compliance and understanding of medical field     PT Frequency  1x / week    PT Duration  8 weeks    PT Treatment/Interventions  ADLs/Self Care Home Management;Cryotherapy;Ultrasound;Traction;Moist Heat;Iontophoresis 4mg70m Dexamethasone;Electrical Stimulation;Gait training;Stair training;Functional mobility training;Neuromuscular re-education;Balance training;Therapeutic exercise;Therapeutic activities;Patient/family  education;Passive range of motion;Manual  techniques;Dry needling;Energy conservation;Taping;Aquatic Therapy;Fluidtherapy    PT Next Visit Plan  pain clinic, aquatic therapy     PT Home Exercise Plan  see sheet    Consulted and Agree with Plan of Care  Patient       Patient will benefit from skilled therapeutic intervention in order to improve the following deficits and impairments:  Abnormal gait, Decreased activity tolerance, Decreased coordination, Decreased balance, Decreased endurance, Decreased range of motion, Decreased mobility, Decreased strength, Difficulty walking, Impaired flexibility, Impaired perceived functional ability, Increased muscle spasms, Impaired sensation, Impaired UE functional use, Postural dysfunction, Improper body mechanics, Pain  Visit Diagnosis: Acute midline low back pain, with sciatica presence unspecified  Other abnormalities of gait and mobility  Decreased range of motion of trunk and back     Problem List Patient Active Problem List   Diagnosis Date Noted  . Gastroesophageal reflux disease without esophagitis 05/31/2016  . Caregiver stress syndrome 04/04/2016  . Anxiety   . Tobacco abuse 07/14/2011    Nichole Klein 06/09/2017, 1:34 PM  [place] MAIN [facility] SERVICES [address] [place], [place], [address] Phone: [phone]   Fax:  [phone]  Name: Nichole Klein MRN: [id number] Date of Birth: May 01, 1966

## 2017-06-09 NOTE — Telephone Encounter (Signed)
Do you want me to update this patients FMLA forms or does she need to take them to her W/C dr?

## 2017-06-15 ENCOUNTER — Ambulatory Visit: Payer: Self-pay | Admitting: Family Medicine

## 2017-06-15 ENCOUNTER — Other Ambulatory Visit: Payer: Self-pay

## 2017-06-15 ENCOUNTER — Encounter: Payer: Self-pay | Admitting: Family Medicine

## 2017-06-15 VITALS — BP 122/72 | HR 91 | Temp 98.1°F | Resp 16 | Ht 62.99 in | Wt 180.0 lb

## 2017-06-15 DIAGNOSIS — S239XXD Sprain of unspecified parts of thorax, subsequent encounter: Secondary | ICD-10-CM

## 2017-06-15 DIAGNOSIS — M5442 Lumbago with sciatica, left side: Secondary | ICD-10-CM

## 2017-06-15 NOTE — Patient Instructions (Signed)
     IF you received an x-ray today, you will receive an invoice from [facility]. Please contact [facility] at [phone] with questions or concerns regarding your invoice.   IF you received labwork today, you will receive an invoice from [facility]. Please contact [facility] at [phone] with questions or concerns regarding your invoice.   Our billing staff will not be able to assist you with questions regarding bills from these companies.  You will be contacted with the lab results as soon as they are available. The fastest way to get your results is to activate your My Chart account. Instructions are located on the last page of this paperwork. If you have not heard from us regarding the results in 2 weeks, please contact this office.     

## 2017-06-15 NOTE — Progress Notes (Signed)
8  Subjective:    Patient ID: Nichole Klein, female    DOB: [birth year], 52 y.o.   MRN: [id number]  06/15/2017  FMLA (paper work for work )    HPI This 52 y.o. female presents for one month follow-up of lower back pain.  Two days after last visit on 05/11/2017, received a package from employer with belongings.  Then received a call from [name] regarding extended leave from work.  Saw April 15, 2017, received request from orthopedist regarding light duty.  No one ever sent information to orthopedist.  Human Resources stated that patient gave up job because never returned to work.  Attorney states that patient must go to court.  May need another lawyer to represent discrimination from [facility] Compensation injury.  [name] stated that short term disability is 90 days and not six months.  Ninety days will be up at end of January 2019.  Cannot complete paperwork until reviewed by attorney.  Kept asking about husband.  Pt expressed that it is time for a second opinion.  Under [name], you must request second opinion.  You submit request for second opinion; then [employer] chooses for second opinion.  FMLA paperwork ends 06/15/17.   [facility] has fourteen days before going to court.  Most are handled during mediation and don't go to court.  Attorney recommends that PCP submitting FMLA forms again.  Orthopedist refused to fill out FMLA paperworks.  [name] told pt that it took so long that case would be closed on 05/11/17 yet she would do an appeals process. Then terminated on 05/13/17.  Pt called [name] and had a lengthy conversation about [name] hold up; FMLA rejected appeal for FMLA paperwork.    Underwent lumbar injection on 05/20/17; returned Dr. Hilda Klein on 05/27/17; no improvement with lower back injection.  Took husband with patient to appointment on 05/27/17.  No surgery here and spine surgeon.   Gave three to four months and recommended permanent restrictions.   Wanting second opinion.  Had an appointment for follow-up on 06/24/17; changed to 07/01/17; go back to see him then.  Has been attending physical therapy; last visit 06/09/17.  Has required five hydrocodone per day; asked for additional medication; orthopedist refused to increase hydrocodone.  On 06/10/17, refill requested.  Went to [facility] pharmacy, only refilled Naproxen.  Did not approve until 06/12/17.  Dr. Hilda Klein of [name].  Constant pain throughout the day.   Severity 8/10 on hydrocodone.  Severity 10/10 without pain medication.  On 05/27/17, physical therapist recommended water aerobics.  Physical therapist also recommended a pain management therapist due to guarding during physical therapist.  Patient unable to take pain medication when goes to physical therapist; cannot take medication when must drive to appointment. Case manager has not called for appointment.  Tomorrow will be third water aerobics therapy session.  When gets in water, feels pressure is on lower back.  Loves the water; member of a private pool during the summer.  Physical therapist pool recommending a second opinion.  She reported pressure in pool to land physical therapist.  Pain middle thoracic region at location of pop; also pain diffusely lower back lumbar spine.  Radiates down LEFT buttocks. Pain behind R knee; both feet and both legs to proximal lower legs with paresthesias.  Certain movements with upper body movements will cause shooting pain up into neck.     BP Readings from Last 3 Encounters:  06/15/17 122/72  05/11/17 (!) 142/80  05/09/17 (!) 142/108   Wt Readings from Last 3 Encounters:  06/15/17 180 lb (81.6 kg)  05/11/17 175 lb (79.4 kg)  03/07/17 150 lb (68 kg)   Immunization History  Administered Date(s) Administered  . Influenza Split 05/15/2012, 04/07/2013  . Influenza-Unspecified 06/11/2014, 03/03/2016  . Pneumococcal Polysaccharide-23 07/10/2012  . Tdap 06/14/2009    Review of Systems    Constitutional: Negative for chills, diaphoresis, fatigue and fever.  Eyes: Negative for visual disturbance.  Respiratory: Negative for cough and shortness of breath.   Cardiovascular: Negative for chest pain, palpitations and leg swelling.  Gastrointestinal: Negative for abdominal pain, constipation, diarrhea, nausea and vomiting.  Endocrine: Negative for cold intolerance, heat intolerance, polydipsia, polyphagia and polyuria.  Genitourinary: Negative for difficulty urinating.  Musculoskeletal: Positive for back pain.  Neurological: Positive for numbness. Negative for dizziness, tremors, seizures, syncope, facial asymmetry, speech difficulty, weakness, light-headedness and headaches.    Past Medical History:  Diagnosis Date  . Anxiety   . Arthritis    DDD lumbar  . Hematuria 06/15/2007   s/p urology consultation Nichole Klein; s/p CT scan negative.  No cystoscopy.  Marland Kitchen Herpes labialis    Past Surgical History:  Procedure Laterality Date  . NOSE SURGERY    . SPINE SURGERY  06/14/2008   Lumbar surgery L5-S1.  Nichole Klein.   No Known Allergies Current Outpatient Medications on File Prior to Visit  Medication Sig Dispense Refill  . ALPRAZolam (XANAX) 0.5 MG tablet Take 1 tablet (0.5 mg total) by mouth at bedtime as needed for anxiety. 30 tablet 1  . cyclobenzaprine (FLEXERIL) 5 MG tablet Take 5 mg by mouth at bedtime.    Marland Kitchen HYDROcodone-acetaminophen (NORCO/VICODIN) 5-325 MG tablet Take 1 tablet by mouth every 6 (six) hours as needed for moderate pain.    . naproxen (NAPROSYN) 500 MG tablet Take 500 mg by mouth 3 (three) times daily.    . ranitidine (ZANTAC) 150 MG tablet Take 150 mg by mouth 2 (two) times daily.     No current facility-administered medications on file prior to visit.    Social History   Socioeconomic History  . Marital status: Married    Spouse name: Nichole Klein  . Number of children: 2  . Years of education: 34  . Highest education level: Not on file  Social Needs  .  Financial resource strain: Not on file  . Food insecurity - worry: Not on file  . Food insecurity - inability: Not on file  . Transportation needs - medical: Not on file  . Transportation needs - non-medical: Not on file  Occupational History  . Occupation: CMA    Employer: [employer]    Comment: [employer]  Tobacco Use  . Smoking status: Current Every Day Smoker    Packs/day: 1.50    Years: 25.00    Pack years: 37.50  . Smokeless tobacco: Never Used  Substance and Sexual Activity  . Alcohol use: No    Comment: special occasions only.  . Drug use: No  . Sexual activity: Yes    Birth control/protection: Surgical    Comment: Husband with vasectomy.  Other Topics Concern  . Not on file  Social History Narrative   Marital status: married x 27 years; happily married; no abuse.      Children: two daughters; three grandchildren.        Employment:  [employer] since 2015      Tobacco:  1-1.25  ppd x 20 years.  Alcohol: two to three drinks per month      Drugs: none      Exercise: daily; videos; floor exercises; situps.  Jogging during lunch in 2017.        Seatbelt:  100%; no texting      Guns:  Loaded; secured.   Family History  Problem Relation Age of Onset  . Diabetes Mother   . Hypertension Mother   . Dementia Mother   . Hyperlipidemia Father   . Cancer Father        prostate  . Stroke Father        after prostate cancer surgery with post-op pneumonia.  . Aortic stenosis Sister   . Hypertension Sister   . Hyperlipidemia Brother   . Hypertension Brother   . Diabetes Brother   . Hyperlipidemia Brother   . Arthritis Brother   . Hypertension Brother   . Mental illness Brother        anxiety  . Stroke Brother        multiple TIAs.  . Breast cancer Neg Hx        Objective:    BP 122/72   Pulse 91   Temp 98.1 F (36.7 C) (Oral)   Resp 16   Ht 5' 2.99" (1.6 m)   Wt 180 lb (81.6 kg)   LMP 08/17/2013 Comment: denies preg   SpO2 97%   BMI 31.89 kg/m  Physical Exam  Constitutional: She is oriented to person, place, and time. She appears well-developed and well-nourished. No distress.  HENT:  Head: Normocephalic and atraumatic.  Nose: Nose normal.  Mouth/Throat: Oropharynx is clear and moist.  Eyes: Conjunctivae and EOM are normal. Pupils are equal, round, and reactive to light.  Neck: Normal range of motion. Carotid bruit is not present.  Cardiovascular: Normal rate.  Pulmonary/Chest: Effort normal. No respiratory distress.  Neurological: She is alert and oriented to person, place, and time. No cranial nerve deficit. She exhibits normal muscle tone. Coordination normal.  Skin: Skin is warm and dry. No rash noted. She is not diaphoretic. No erythema. No pallor.  Psychiatric: She has a normal mood and affect. Her behavior is normal.   No results found. Depression screen [facility] 2/9 06/15/2017 05/11/2017 05/18/2016 03/10/2016 12/02/2015  Decreased Interest 0 0 0 1 0  Down, Depressed, Hopeless 0 0 0 3 0  PHQ - 2 Score 0 0 0 4 0  Altered sleeping - - - 2 -  Tired, decreased energy - - - 2 -  Change in appetite - - - 1 -  Feeling bad or failure about yourself  - - - 2 -  Trouble concentrating - - - 0 -  Moving slowly or fidgety/restless - - - 0 -  Suicidal thoughts - - - 0 -  PHQ-9 Score - - - 11 -  Difficult doing work/chores - - - Not difficult at all -   Fall Risk  06/15/2017 05/11/2017 03/10/2016 12/02/2015 03/15/2015  Falls in the past year? No No No No No        Assessment & Plan:   1. Acute bilateral low back pain with left-sided sciatica   2. Thoracic back sprain, subsequent encounter     -Ongoing moderate to severe lower back pain with left-sided sciatica radiculopathy.  Requiring hydrocodone every 4 hours for pain control.  Continues naproxen twice daily as well.  Unable to work due to ongoing needs for opiate to control pain.  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  Judgment and  professionalism is impaired on opiate therapy. -Status post orthopedic consultation and ongoing management by Dr. Artelia Larocheavid Musante on 11/2, 11/16, 12/14.  -Status post MRI of lumbar spine on 04/23/17 that revealed left laminectomy at L5-S1 without recurrent disc protrusion.  Progressive disc degeneration and mild spurring at L5-S1. -Undergoing physical therapy for the following dates: 11/9, 11/13, 11/20, 11/26, 11/29, 12/3, 12/5, 12/11, 12/13, 12/18, 12/27.   -Status post lumbar injection on 05/20/17 without improvement in symptoms. -Patient is motivated to return to work and is hoping for significant improvement in current symptoms. -FMLA paperwork has been completedTextended.  Unfortunately, patient has not progressing as expected with physical therapy, lumbar injection, anti-inflammatories.  FMLA paperwork has been extended until August 12, 2017. -Continue management of acute lower back pain with radiculopathy per Dr. Vonda AntiguaMusanti.  No orders of the defined types were placed in this encounter.  No orders of the defined types were placed in this encounter.   No Follow-up on file.   Huberta Tompkins Paulita FujitaMartin Yeraldy Spike, M.D. [facility] at [facility]  [place] [facility] [address] [place], [place]  [id number] [phone] phone [phone] fax

## 2017-06-15 NOTE — Telephone Encounter (Signed)
Patient again called wanting an extension on her FMLA forms--I explained that if her W/C dr is the one that is taking her out of work then he need to be the one that completes her FMLA forms--she states Dr Nichole Klein is the one that has to complete these--what would you like for me to do?

## 2017-06-16 ENCOUNTER — Ambulatory Visit: Payer: PRIVATE HEALTH INSURANCE | Attending: Orthopaedic Surgery

## 2017-06-16 DIAGNOSIS — R2689 Other abnormalities of gait and mobility: Secondary | ICD-10-CM | POA: Insufficient documentation

## 2017-06-16 DIAGNOSIS — M2560 Stiffness of unspecified joint, not elsewhere classified: Secondary | ICD-10-CM | POA: Diagnosis present

## 2017-06-16 DIAGNOSIS — M545 Low back pain: Secondary | ICD-10-CM | POA: Insufficient documentation

## 2017-06-16 DIAGNOSIS — M2569 Stiffness of other specified joint, not elsewhere classified: Secondary | ICD-10-CM

## 2017-06-16 NOTE — Therapy (Signed)
[place] MAIN [facility] SERVICES [address] [place], [place], [address] Phone: [phone]   Fax:  [phone]  Physical Therapy Treatment  Patient Details  Name: Nichole Klein MRN: [id number] Date of Birth: 03/19/1966 Referring Provider: Dr. Charlotte Sanes   Encounter Date: 06/16/2017  PT End of Session - 06/16/17 1358    Visit Number  12    Number of Visits  17    Date for PT Re-Evaluation  07/22/17    PT Start Time  0930    PT Stop Time  1030    PT Time Calculation (min)  60 min       Past Medical History:  Diagnosis Date  . Anxiety   . Arthritis    DDD lumbar  . Hematuria 06/15/2007   s/p urology consultation Edrick Oh; s/p CT scan negative.  No cystoscopy.  Marland Kitchen Herpes labialis     Past Surgical History:  Procedure Laterality Date  . NOSE SURGERY    . SPINE SURGERY  06/14/2008   Lumbar surgery L5-S1.  [place].    There were no vitals filed for this visit.  Subjective Assessment - 06/16/17 1353    Subjective  Continues with 8/10 mid/low back pain and distal BLE tingling. Pt wishes to continue aquatic sessions for the hopes of relieving symptoms.       Enters/exits pool via ramp Participates in the following  Noodle hang for decompression/relaxation 10 min  Abdominal stabilization at wall, 20 x 10 sec hold  Abdominal stabilization at wall with small single knee tuck, 10 x 10 R/L ea  Ambulation - 4 L fwd - 4 L side, L lead only  Minisquats at rail, 20x  Stretching, 3 x 15 sec each - hamstrings - calves - SKTC - piriformis (cross body knee to chest)                        PT Education - 06/16/17 1357    Education provided  Yes    Education Details  Muscles guarding process and focus on breathing, body position awareness and attempting more fluid movement in the water, as water has supportive properties and relief of gravity       PT Short Term Goals - 06/16/17 1403      PT SHORT TERM  GOAL #1   Title  Patient will be independent in home exercise program to improve strength/mobility for better functional independence with ADLs.    Baseline  HEP performed    Time  2    Period  Weeks    Status  Partially Met      PT SHORT TERM GOAL #2   Title  Patient will report a worst pain of 5/10 on VAS in back   to improve tolerance with ADLs and reduced symptoms with activities.     Baseline  9/10 pain 12/5: 9/10 pain 12/13: 9/10 pain    Time  2    Period  Weeks    Status  On-going      PT SHORT TERM GOAL #3   Title  Patient will increase BLE gross strength to 4+/5 as to improve functional strength for independent gait, increased standing tolerance and increased ADL ability.    Baseline  2+/5 due to restriction of pain in ROM 12/5: same 12/13: same     Time  2    Period  Weeks    Status  On-going  PT Long Term Goals - 06/16/17 1403      PT LONG TERM GOAL #1   Title  Patient will increase 10 meter walk test to >1.58ms as to improve gait speed for better community ambulation and to reduce fall risk.    Baseline  11/9: .432m 12/5: .344m12/13: .56 m/s    Time  8    Period  Weeks    Status  On-going      PT LONG TERM GOAL #2   Title  Patient will report a worst pain of 3/10 on VAS in back to improve tolerance with ADLs and reduced symptoms with activities.     Baseline  11/9: 9/10 12/5: 9/10 12/13: 9/10    Time  8    Period  Weeks    Status  On-going      PT LONG TERM GOAL #3   Title  Patient will decrease FABQ scores by 10 points (75/96) to reduce pain avoidant behavior and improve quality of life.     Baseline  11/9: FABQ 85/96: FABQPA 20, FABQW: 39 12/5: FABQ: 77/96; 12/13: 78/96 FABQPA: 16 FABQW: 31    Time  8    Period  Weeks    Status  Partially Met      PT LONG TERM GOAL #4   Title  Patient will reduce modified Oswestry score to <20 as to demonstrate minimal disability with ADLs including improved sleeping tolerance, walking/sitting tolerance etc for  better mobility with ADLs.     Baseline  11/9: 58% 12/5: 54% 12/13: 60 %    Time  8    Period  Weeks    Status  On-going      PT LONG TERM GOAL #5   Title  Patient (< 60 18ars old) will complete five times sit to stand test in < 10 seconds indicating an increased LE strength and improved balance.    Baseline  72 seconds 12/5: 50 seconds with excessive UE assistance 12/13: 45 seconds pushing up with R hand from handrest and L hand from knee    Time  8    Period  Weeks    Status  Partially Met            Plan - 06/16/17 1359    Clinical Impression Statement  Pt able to demonstrate more fluid movement post education of body awareness and water properties. Tolerated abdominal stabilization exercises with improving ease throughout session.     Rehab Potential  Fair    Clinical Impairments Affecting Rehab Potential  saddle region affected, caregiver to mother, activity exacerbation (+) good compliance and understanding of medical field     PT Frequency  1x / week    PT Duration  8 weeks    PT Treatment/Interventions  ADLs/Self Care Home Management;Cryotherapy;Ultrasound;Traction;Moist Heat;Iontophoresis 4mg69m Dexamethasone;Electrical Stimulation;Gait training;Stair training;Functional mobility training;Neuromuscular re-education;Balance training;Therapeutic exercise;Therapeutic activities;Patient/family education;Passive range of motion;Manual techniques;Dry needling;Energy conservation;Taping;Aquatic Therapy;Fluidtherapy    PT Next Visit Plan  pain clinic, aquatic therapy     PT Home Exercise Plan  see sheet    Consulted and Agree with Plan of Care  Patient       Patient will benefit from skilled therapeutic intervention in order to improve the following deficits and impairments:  Abnormal gait, Decreased activity tolerance, Decreased coordination, Decreased balance, Decreased endurance, Decreased range of motion, Decreased mobility, Decreased strength, Difficulty walking, Impaired  flexibility, Impaired perceived functional ability, Increased muscle spasms, Impaired sensation, Impaired UE functional use, Postural dysfunction, Improper body  mechanics, Pain  Visit Diagnosis: Acute midline low back pain, with sciatica presence unspecified  Other abnormalities of gait and mobility  Decreased range of motion of trunk and back     Problem List Patient Active Problem List   Diagnosis Date Noted  . Gastroesophageal reflux disease without esophagitis 05/31/2016  . Caregiver stress syndrome 04/04/2016  . Anxiety   . Tobacco abuse 07/14/2011    Larae Grooms 06/16/2017, 2:04 PM  [place] MAIN [facility] SERVICES [address] [place] [address], [place], [address] Phone: [phone]   Fax:  [phone]  Name: Nichole Klein MRN: [id number] Date of Birth: 01-31-1966

## 2017-06-21 ENCOUNTER — Ambulatory Visit: Payer: Worker's Compensation

## 2017-06-22 ENCOUNTER — Ambulatory Visit: Payer: PRIVATE HEALTH INSURANCE

## 2017-06-22 DIAGNOSIS — M545 Low back pain: Secondary | ICD-10-CM | POA: Diagnosis not present

## 2017-06-22 DIAGNOSIS — M2560 Stiffness of unspecified joint, not elsewhere classified: Secondary | ICD-10-CM

## 2017-06-22 DIAGNOSIS — M2569 Stiffness of other specified joint, not elsewhere classified: Secondary | ICD-10-CM

## 2017-06-22 DIAGNOSIS — R2689 Other abnormalities of gait and mobility: Secondary | ICD-10-CM

## 2017-06-22 NOTE — Therapy (Signed)
[place] MAIN [facility] SERVICES [address] [place], [place], [address] Phone: [phone]   Fax:  [phone]  Physical Therapy Treatment  Patient Details  Name: Nichole Klein MRN: [id number] Date of Birth: May 29, 1966 Referring Provider: Dr. Charlotte Sanes   Encounter Date: 06/22/2017  PT End of Session - 06/22/17 1324    Visit Number  13    Number of Visits  17    Date for PT Re-Evaluation  07/22/17    PT Start Time  1300    PT Stop Time  1344    PT Time Calculation (min)  44 min    Activity Tolerance  Patient limited by pain    Behavior During Therapy  [facility] for tasks assessed/performed       Past Medical History:  Diagnosis Date  . Anxiety   . Arthritis    DDD lumbar  . Hematuria 06/15/2007   s/p urology consultation Edrick Oh; s/p CT scan negative.  No cystoscopy.  Marland Kitchen Herpes labialis     Past Surgical History:  Procedure Laterality Date  . NOSE SURGERY    . SPINE SURGERY  06/14/2008   Lumbar surgery L5-S1.  [place].    There were no vitals filed for this visit.  Subjective Assessment - 06/22/17 1302    Subjective  Patient reports that pool has been going well. Feels slight increase in pressure on back while in water. Reports hot tub feeling good.  When lead off with left side side walking have more pressure in back than when leading with right. Reports walking better, not faster but better, feels like its digging in when walking fast.      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 Felt a pop in the middle of back. This was October 22nd. Pain in the middle back and lower back, both feet and both calves pins and needles, constant. Pain going down left buttcheek, vagina is numb. X rays taken showing degenerative disc disease of back, had laminectomy 10 years ago at L5 S1. When patient lays down over heating pad it makes back feel better. Ice doesn't help at  all. Was on prednisone and robaxen. Ortho doctor changed medications to flexoril and hydrocodone.     Currently in Pain?  Yes    Pain Score  7     Pain Location  Back    Pain Orientation  Mid;Lower    Pain Descriptors / Indicators  Aching    Pain Type  Chronic pain    Pain Radiating Towards  pain into neck, shoulders, legs.     Pain Onset  More than a month ago    Pain Frequency  Constant      VAS:  Least 7/10 Worst 8/10 without medication  MMT  Right Left  Hip flexion 4/5 4-/5  Hip Abduction 4/5 4-/5  Hip Adduction 4/5 4-/5  Knee Extension  4/5 pain  Knee Flexion 4/5 4-/5  DF 4/5 4-/5  PF 4/5 4-/5      10MWT: 12 seconds with pain, 18 seconds without pain = .83 m/s with walking  FABQ: 82/ 96 MODI= 27% 5x STS =32 seconds   Posture: improved posture, decreased thoracic flexion.   Middle of back still hurts, now radiating to neck and shoulders. Will see doctor again Jan 18th.    Education on shoe wear on affects of back pain. Wear shoes with backs on them  Gait mechanics: ambulating with improved hip sway and trunk rotation, continues to have decreased extension bilaterally of LE's and slight waddle gait                     PT Education - 06/22/17 1323    Education provided  Yes    Education Details  continuation of pool therpay, improvements in goal progression, wearing shoes with backs to decrease strain on low back.     Person(s) Educated  Patient    Methods  Explanation;Demonstration;Verbal cues    Comprehension  Verbalized understanding;Returned demonstration;Verbal cues required       PT Short Term Goals - 06/22/17 1325      PT SHORT TERM GOAL #1   Title  Patient will be independent in home exercise program to improve strength/mobility for better functional independence with ADLs.    Baseline  HEP performed    Time  2    Period  Weeks    Status  Partially Met      PT SHORT TERM GOAL #2   Title  Patient will report a worst pain of 5/10  on VAS in back   to improve tolerance with ADLs and reduced symptoms with activities.     Baseline  9/10 pain 12/5: 9/10 pain 12/13: 9/10 pain: 1/9: 8/10    Time  2    Period  Weeks    Status  Partially Met      PT SHORT TERM GOAL #3   Title  Patient will increase BLE gross strength to 4+/5 as to improve functional strength for independent gait, increased standing tolerance and increased ADL ability.    Baseline  2+/5 due to restriction of pain in ROM 12/5: same 12/13: same : 1/9: 4/5    Time  2    Period  Weeks    Status  Partially Met        PT Long Term Goals - 06/22/17 1314      PT LONG TERM GOAL #1   Title  Patient will increase 10 meter walk test to >1.39ms as to improve gait speed for better community ambulation and to reduce fall risk.    Baseline  11/9: .412m 12/5: .3490m12/13: .56 m/s; 1/9: .83 m/s with pain    Time  8    Period  Weeks    Status  On-going      PT LONG TERM GOAL #2   Title  Patient will report a worst pain of 3/10 on VAS in back to improve tolerance with ADLs and reduced symptoms with activities.     Baseline  11/9: 9/10 12/5: 9/10 12/13: 9/10 1/9: 8/10    Time  8    Period  Weeks    Status  On-going      PT LONG TERM GOAL #3   Title  Patient will decrease FABQ scores by 10 points (75/96) to reduce pain avoidant behavior and improve quality of life.     Baseline  11/9: FABQ 85/96: FABQPA 20, FABQW: 39 12/5: FABQ: 77/96; 12/13: 78/96 FABQPA: 16 FABQW: 31; 1/9: 82/ 96    Time  8    Period  Weeks    Status  Partially Met      PT LONG TERM GOAL #4   Title  Patient will reduce modified Oswestry score to <20 as to demonstrate minimal disability with ADLs including improved sleeping tolerance, walking/sitting tolerance etc for better mobility with ADLs.     Baseline  11/9: 58% 12/5: 54% 12/13: 60 %; 1/9: 27%    Time  8    Period  Weeks    Status  Partially Met      PT LONG TERM GOAL #5   Title  Patient (< 45 years old) will complete five times sit  to stand test in < 10 seconds indicating an increased LE strength and improved balance.    Baseline  72 seconds 12/5: 50 seconds with excessive UE assistance 12/13: 45 seconds pushing up with R hand from handrest and L hand from knee: 32 seconds pushing with L hand from hand rest and R hand from knee     Time  8    Period  Weeks    Status  Partially Met            Plan - 06/22/17 1829    Clinical Impression Statement   Patient presents with progression towards goals.BLE strength improving, however LLE continues to be weaker than RLE at this time. Pain has decreased to range between 7-8/10 rather than the 8-9/10 it was previously, 10 MWT= .83 m/s with 8/10 pain. 5x STS=32 seconds with SUE support, MODI=27%.  Patient will continue to benefit from aquatic (pool) therapy to continue progressing towards land based mobility and pain relief for return to previous level of function.     Rehab Potential  Fair    Clinical Impairments Affecting Rehab Potential  saddle region affected, caregiver to mother, activity exacerbation (+) good compliance and understanding of medical field     PT Frequency  1x / week    PT Duration  4 weeks    PT Treatment/Interventions  ADLs/Self Care Home Management;Cryotherapy;Ultrasound;Traction;Moist Heat;Iontophoresis 65m/ml Dexamethasone;Electrical Stimulation;Gait training;Stair training;Functional mobility training;Neuromuscular re-education;Balance training;Therapeutic exercise;Therapeutic activities;Patient/family education;Passive range of motion;Manual techniques;Dry needling;Energy conservation;Taping;Aquatic Therapy;Fluidtherapy    PT Next Visit Plan  pain clinic, aquatic therapy     PT Home Exercise Plan  see sheet    Consulted and Agree with Plan of Care  Patient       Patient will benefit from skilled therapeutic intervention in order to improve the following deficits and impairments:  Abnormal gait, Decreased activity tolerance, Decreased coordination,  Decreased balance, Decreased endurance, Decreased range of motion, Decreased mobility, Decreased strength, Difficulty walking, Impaired flexibility, Impaired perceived functional ability, Increased muscle spasms, Impaired sensation, Impaired UE functional use, Postural dysfunction, Improper body mechanics, Pain  Visit Diagnosis: Acute midline low back pain, with sciatica presence unspecified  Other abnormalities of gait and mobility  Decreased range of motion of trunk and back     Problem List Patient Active Problem List   Diagnosis Date Noted  . Gastroesophageal reflux disease without esophagitis 05/31/2016  . Caregiver stress syndrome 04/04/2016  . Anxiety   . Tobacco abuse 07/14/2011   MJanna Arch PT, DPT   MJanna Arch1/02/2018, 6:31 PM  [place] [facility] [address] [place] [address]Phone: [phone]  Fax:  [phone] Name: ROlinda NolaMRN: [id number] of Birth: [birth year]

## 2017-06-28 ENCOUNTER — Other Ambulatory Visit: Payer: Self-pay

## 2017-06-28 ENCOUNTER — Ambulatory Visit: Payer: PRIVATE HEALTH INSURANCE

## 2017-06-28 DIAGNOSIS — M2560 Stiffness of unspecified joint, not elsewhere classified: Secondary | ICD-10-CM

## 2017-06-28 DIAGNOSIS — M545 Low back pain: Secondary | ICD-10-CM | POA: Diagnosis not present

## 2017-06-28 DIAGNOSIS — R2689 Other abnormalities of gait and mobility: Secondary | ICD-10-CM

## 2017-06-28 DIAGNOSIS — M2569 Stiffness of other specified joint, not elsewhere classified: Secondary | ICD-10-CM

## 2017-06-28 NOTE — Therapy (Signed)
[place] MAIN [facility] SERVICES [address] [place], [place], [address] Phone: [phone]   Fax:  [phone]  Physical Therapy Treatment  Patient Details  Name: Nichole Klein MRN: [id number] Date of Birth: 1966/02/24 Referring Provider: Dr. Charlotte Sanes   Encounter Date: 06/28/2017  PT End of Session - 06/28/17 1437    Visit Number  14    Number of Visits  20    Date for PT Re-Evaluation  08/03/17    PT Start Time  1115    PT Stop Time  1215    PT Time Calculation (min)  60 min    Activity Tolerance  Patient limited by pain    Behavior During Therapy  [facility] for tasks assessed/performed       Past Medical History:  Diagnosis Date  . Anxiety   . Arthritis    DDD lumbar  . Hematuria 06/15/2007   s/p urology consultation Edrick Oh; s/p CT scan negative.  No cystoscopy.  Marland Kitchen Herpes labialis     Past Surgical History:  Procedure Laterality Date  . NOSE SURGERY    . SPINE SURGERY  06/14/2008   Lumbar surgery L5-S1.  [place].    There were no vitals filed for this visit.  Subjective Assessment - 06/28/17 1435    Subjective  Pt reports she does notice she is walking with improved speed although not baseline. Also feels she is a little stronger through core. Pain, however, is untouched and continues to limit her life.     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 Felt a pop in the middle of back. This was October 22nd. Pain in the middle back and lower back, both feet and both calves pins and needles, constant. Pain going down left buttcheek, vagina is numb. X rays taken showing degenerative disc disease of back, had laminectomy 10 years ago at L5 S1. When patient lays down over heating pad it makes back feel better. Ice doesn't help at all. Was on prednisone and robaxen. Ortho doctor changed medications to flexoril and hydrocodone.       Enters/exits pool via  ramp Participates in the following  Ambulation - 4 L fwd - 4 laps side, R/L ea lead  Abdominal stabilization at wall, straight stance, 30x ea - UE activities, speed for resist  - flex/ext  - abd/add  - horizontal abd/add  - LE activities  - B, mini SKTC tuck  - B outward hip circles  Bench - bike, 5 min  Stretching, B 3 x 15 sec - ham/gastroc - SKTC - fig 4 piriformis                          PT Short Term Goals - 06/28/17 1439      PT SHORT TERM GOAL #1   Title  Patient will be independent in home exercise program to improve strength/mobility for better functional independence with ADLs.    Baseline  HEP performed    Time  2    Period  Weeks    Status  Partially Met      PT SHORT TERM GOAL #2   Title  Patient will report a worst pain of 5/10 on VAS in back   to improve tolerance with ADLs and reduced symptoms with activities.     Baseline  9/10 pain 12/5: 9/10 pain 12/13: 9/10 pain:  1/9: 8/10    Time  2    Period  Weeks    Status  Partially Met      PT SHORT TERM GOAL #3   Title  Patient will increase BLE gross strength to 4+/5 as to improve functional strength for independent gait, increased standing tolerance and increased ADL ability.    Baseline  2+/5 due to restriction of pain in ROM 12/5: same 12/13: same : 1/9: 4/5    Time  2    Period  Weeks    Status  Partially Met        PT Long Term Goals - 06/28/17 1439      PT LONG TERM GOAL #1   Title  Patient will increase 10 meter walk test to >1.72ms as to improve gait speed for better community ambulation and to reduce fall risk.    Baseline  11/9: .427m 12/5: .349m12/13: .56 m/s; 1/9: .83 m/s with pain    Time  8    Period  Weeks    Status  On-going      PT LONG TERM GOAL #2   Title  Patient will report a worst pain of 3/10 on VAS in back to improve tolerance with ADLs and reduced symptoms with activities.     Baseline  11/9: 9/10 12/5: 9/10 12/13: 9/10 1/9: 8/10    Time  8     Period  Weeks    Status  On-going      PT LONG TERM GOAL #3   Title  Patient will decrease FABQ scores by 10 points (75/96) to reduce pain avoidant behavior and improve quality of life.     Baseline  11/9: FABQ 85/96: FABQPA 20, FABQW: 39 12/5: FABQ: 77/96; 12/13: 78/96 FABQPA: 16 FABQW: 31; 1/9: 82/ 96    Time  8    Period  Weeks    Status  Partially Met      PT LONG TERM GOAL #4   Title  Patient will reduce modified Oswestry score to <20 as to demonstrate minimal disability with ADLs including improved sleeping tolerance, walking/sitting tolerance etc for better mobility with ADLs.     Baseline  11/9: 58% 12/5: 54% 12/13: 60 %; 1/9: 27%    Time  8    Period  Weeks    Status  Partially Met      PT LONG TERM GOAL #5   Title  Patient (< 60 81ars old) will complete five times sit to stand test in < 10 seconds indicating an increased LE strength and improved balance.    Baseline  72 seconds 12/5: 50 seconds with excessive UE assistance 12/13: 45 seconds pushing up with R hand from handrest and L hand from knee: 32 seconds pushing with L hand from hand rest and R hand from knee     Time  8    Period  Weeks    Status  Partially Met            Plan - 06/28/17 1437    Clinical Impression Statement  Tolerates exercises well without increased in pain during/post session. Added figure 4 piriformis stretch with good response. Able to demonstrate improved ambulation fluidity with cues to do so in the water.     Rehab Potential  Fair    Clinical Impairments Affecting Rehab Potential  saddle region affected, caregiver to mother, activity exacerbation (+) good compliance and understanding of medical field     PT Frequency  1x /  week    PT Duration  6 weeks    PT Treatment/Interventions  ADLs/Self Care Home Management;Cryotherapy;Ultrasound;Traction;Moist Heat;Iontophoresis 67m/ml Dexamethasone;Electrical Stimulation;Gait training;Stair training;Functional mobility training;Neuromuscular  re-education;Balance training;Therapeutic exercise;Therapeutic activities;Patient/family education;Passive range of motion;Manual techniques;Dry needling;Energy conservation;Taping;Aquatic Therapy;Fluidtherapy    PT Next Visit Plan  pain clinic, aquatic therapy     PT Home Exercise Plan  see sheet    Consulted and Agree with Plan of Care  Patient       Patient will benefit from skilled therapeutic intervention in order to improve the following deficits and impairments:  Abnormal gait, Decreased activity tolerance, Decreased coordination, Decreased balance, Decreased endurance, Decreased range of motion, Decreased mobility, Decreased strength, Difficulty walking, Impaired flexibility, Impaired perceived functional ability, Increased muscle spasms, Impaired sensation, Impaired UE functional use, Postural dysfunction, Improper body mechanics, Pain  Visit Diagnosis: Acute midline low back pain, with sciatica presence unspecified  Other abnormalities of gait and mobility  Decreased range of motion of trunk and back     Problem List Patient Active Problem List   Diagnosis Date Noted  . Gastroesophageal reflux disease without esophagitis 05/31/2016  . Caregiver stress syndrome 04/04/2016  . Anxiety   . Tobacco abuse 07/14/2011    HLarae Grooms1/15/2019, 2:40 PM  [place] [facility] [address] [place] [address]Phone: [phone]  Fax:  [phone] Name: RJasmane BrockwayMRN: [id number] of Birth: 408-Nov-1967

## 2017-07-05 ENCOUNTER — Ambulatory Visit: Payer: PRIVATE HEALTH INSURANCE

## 2017-07-05 ENCOUNTER — Telehealth: Payer: Self-pay | Admitting: Family Medicine

## 2017-07-05 NOTE — Telephone Encounter (Signed)
PPW WAS PROCESSED ON 06/30/2017 IT IS SCANNED IN HER CHART UNDER MEDIA

## 2017-07-05 NOTE — Telephone Encounter (Signed)
Copied from CRM [id number]. Topic: General - Other >> Jun 02, 2017 11:44 AM Jolayne Hainesaylor, Brittany L wrote: Pt needs her FMLA papers extended bc the papers say that she should have seen improvement by 06/15/17. Dr Katrinka BlazingSmith does not have any open appts until late jan. Patient states she is not feeling any better. She is still being seen by the workers comp doctor. He told her if she wasn't better within the next few months then she would be on permanent restrictions. Patient states she does not have any insurance right now bc [facility] voluntary gave up her job & that is not true. She has been to a Clinical research associatelawyer. She said she will pay OOP if she needs to. She said her lawyer has advised her to talk with Dr Katrinka BlazingSmith about this. He thinks that she needs to have this amended for an extended date. Please call back @ [phone]  >> Jun 15, 2017  8:37 AM Crist InfanteHarrald, Kathy J wrote: Pt following up on paperwork that needs to be addressed as soon as possible. Pt states she is still not able to go back to work. Pt has made appt to see Dr Katrinka BlazingSmith >> Jun 15, 2017  9:10 AM Elveria Risingockman, Caitlin P wrote: Multiple messages have been sent to Dr Katrinka BlazingSmith about this situation, patient is not being seen by a DR here for her issues and should have her W/C DR do her FMLA not us  Also messages have been sent to pt through her MyChart from Dr Katrinka BlazingSmith.  Will try to contact patient >> Jul 05, 2017 11:29 AM Elliot GaultBell, Tiffany M wrote:  Relation to BJ:YNWGpt:self Call back number: [phone]   Reason for call:  Patient would like "capability and limitation form" mailed to her home.  Patient wanted to inform PCP, 07/01/17 appointment with Dr. Waymon BudgeMusante orthorpedic surgeon  switched medication to tylenol # 3.  Set up work conditions and permitent restriction.   >> Jul 05, 2017 11:44 AM Johnney OuFrancisco, Juan L, RN wrote: Pt needs OV.

## 2017-07-05 NOTE — Telephone Encounter (Signed)
Please document when recent FMLA paperwork processed.

## 2017-07-11 ENCOUNTER — Telehealth: Payer: Self-pay | Admitting: Family Medicine

## 2017-07-11 ENCOUNTER — Encounter: Payer: Self-pay | Admitting: Family Medicine

## 2017-07-11 NOTE — Telephone Encounter (Unsigned)
Copied from CRM [id number]. Topic: Quick Communication - See Telephone Encounter >> Jul 11, 2017  2:13 PM Floria RavelingStovall, Shana A wrote: CRM for notification. See Telephone encounter for: pt called in and said that today was the last day that her FMLA paperwork could be turned in.  She is wanting to know the status asap  Best number is [phone]   07/11/17.

## 2017-07-11 NOTE — Telephone Encounter (Signed)
I spoke with Pt and she was  notified both forms were faxed on 06/30/17

## 2017-07-12 ENCOUNTER — Ambulatory Visit: Payer: PRIVATE HEALTH INSURANCE

## 2017-07-12 ENCOUNTER — Ambulatory Visit: Payer: Worker's Compensation

## 2017-07-13 MED ORDER — HYDROCODONE-ACETAMINOPHEN 7.5-325 MG PO TABS: 1.0000 | ORAL_TABLET | Freq: Four times a day (QID) | ORAL | 0 refills | Status: DC | PRN

## 2017-07-13 NOTE — Telephone Encounter (Signed)
Patient called reporting that she realized that Dr. Thyra BreedMark Phillips was strictly a pain management provider.  She will not be going to see him.  She has scheduled an appointment with Dr. Sharolyn DouglasMax Cohen on February 21 for second opinion.  Patient is scheduled for a complete physical exam next month.  She does not need a referral to Dr. Sharolyn DouglasMax Cohen.  She is currently out of pain medication since she has been discharged from Dr. Waymon BudgeMusante of orthopedics.  A/P: DDD lumbar spine with acute sciatica:  rx for hydrocodone provided to control pain until appointment with Dr. Noel Geroldohen on 08/04/17.

## 2017-07-14 ENCOUNTER — Telehealth: Payer: Self-pay

## 2017-07-14 NOTE — Telephone Encounter (Signed)
Received approval through cover my meds.  A fax is to follow with dates of approval.  Contacted the pharmacy to let them know.  They will notify the patient.

## 2017-07-14 NOTE — Telephone Encounter (Signed)
I completed PA through cover my meds today for patient's hydrocodone.  Key [id number].  May take up to 48 business hours for result.

## 2017-07-19 ENCOUNTER — Ambulatory Visit: Payer: Worker's Compensation

## 2017-07-19 ENCOUNTER — Ambulatory Visit: Payer: PRIVATE HEALTH INSURANCE

## 2017-07-27 NOTE — Progress Notes (Signed)
Subjective:    Patient ID: Nichole Klein, female    DOB: May 08, 1966, 52 y.o.   MRN: [id number]  08/01/2017  Annual Exam    HPI This 52 y.o. female presents for Complete Physical Examination.  Last physical:    12-02-2015 Pap smear:  11-2014  WNL; LMP 2 years ago; horrible hot flashes Mammogram:  03-2017 Colonoscopy: never; agreeable; would like to wait until after lower back issues resolved. Eye exam:never; due; readers Dental exam:  Every six months.  [facility]   Visual Acuity Screening   Right eye Left eye Both eyes  Without correction: 20/20 20/20 20/20   With correction:       BP Readings from Last 3 Encounters:  08/01/17 122/68  06/15/17 122/72  05/11/17 (!) 142/80   Wt Readings from Last 3 Encounters:  08/01/17 181 lb (82.1 kg)  06/15/17 180 lb (81.6 kg)  05/11/17 175 lb (79.4 kg)   Immunization History  Administered Date(s) Administered  . Influenza Split 05/15/2012, 04/07/2013  . Influenza-Unspecified 06/11/2014, 03/03/2016, 03/26/2017  . Pneumococcal Polysaccharide-23 07/10/2012  . Tdap 06/14/2009   Health Maintenance  Topic Date Due  . COLONOSCOPY  10/03/2016  . MAMMOGRAM  03/19/2019  . TETANUS/TDAP  06/15/2019  . PAP SMEAR  08/01/2020  . INFLUENZA VACCINE  Completed  . HIV Screening  Completed    Review of Systems  Constitutional: Positive for activity change. Negative for appetite change, chills, diaphoresis, fatigue, fever and unexpected weight change.  HENT: Negative for congestion, dental problem, drooling, ear discharge, ear pain, facial swelling, hearing loss, mouth sores, nosebleeds, postnasal drip, rhinorrhea, sinus pressure, sneezing, sore throat, tinnitus, trouble swallowing and voice change.   Eyes: Negative for photophobia, pain, discharge, redness, itching and visual disturbance.  Respiratory: Negative for apnea, cough, choking, chest tightness, shortness of breath, wheezing and stridor.   Cardiovascular: Negative for chest pain,  palpitations and leg swelling.  Gastrointestinal: Positive for constipation. Negative for abdominal distention, abdominal pain, anal bleeding, blood in stool, diarrhea, nausea, rectal pain and vomiting.  Endocrine: Negative for cold intolerance, heat intolerance, polydipsia, polyphagia and polyuria.  Genitourinary: Negative for decreased urine volume, difficulty urinating, dyspareunia, dysuria, enuresis, flank pain, frequency, genital sores, hematuria, menstrual problem, pelvic pain, urgency, vaginal bleeding, vaginal discharge and vaginal pain.       Intermittent urinary leakage.   Musculoskeletal: Positive for back pain, gait problem and neck stiffness. Negative for arthralgias, joint swelling, myalgias and neck pain.  Skin: Negative for color change, pallor, rash and wound.  Allergic/Immunologic: Negative for environmental allergies, food allergies and immunocompromised state.  Neurological: Negative for dizziness, tremors, seizures, syncope, facial asymmetry, speech difficulty, weakness, light-headedness, numbness and headaches.  Hematological: Negative for adenopathy. Does not bruise/bleed easily.  Psychiatric/Behavioral: Positive for sleep disturbance. Negative for agitation, behavioral problems, confusion, decreased concentration, dysphoric mood, hallucinations, self-injury and suicidal ideas. The patient is not nervous/anxious and is not hyperactive.     Past Medical History:  Diagnosis Date  . Anxiety   . Arthritis    DDD lumbar  . Hematuria 06/15/2007   s/p urology consultation Assunta GamblesBrian Cope; s/p CT scan negative.  No cystoscopy.  Marland Kitchen. Herpes labialis    Past Surgical History:  Procedure Laterality Date  . NOSE SURGERY    . SPINE SURGERY  06/14/2008   Lumbar surgery L5-S1.  [place].   No Known Allergies Current Outpatient Medications on File Prior to Visit  Medication Sig Dispense Refill  . ALPRAZolam (XANAX) 0.5 MG tablet Take 1 tablet (0.5 mg total)  by mouth at bedtime as needed for  anxiety. 30 tablet 1  . HYDROcodone-acetaminophen (NORCO) 7.5-325 MG tablet Take 1 tablet by mouth every 6 (six) hours as needed for moderate pain. 60 tablet 0  . ranitidine (ZANTAC) 150 MG tablet Take 150 mg by mouth 2 (two) times daily.     No current facility-administered medications on file prior to visit.    Social History   Socioeconomic History  . Marital status: Married    Spouse name: [name]  . Number of children: 2  . Years of education: 60  . Highest education level: Not on file  Social Needs  . Financial resource strain: Not on file  . Food insecurity - worry: Not on file  . Food insecurity - inability: Not on file  . Transportation needs - medical: Not on file  . Transportation needs - non-medical: Not on file  Occupational History  . Occupation: unemployed    Comment: CMA  Tobacco Use  . Smoking status: Current Every Day Smoker    Packs/day: 1.50    Years: 25.00    Pack years: 37.50  . Smokeless tobacco: Never Used  Substance and Sexual Activity  . Alcohol use: No    Comment: special occasions only.  . Drug use: No  . Sexual activity: Yes    Birth control/protection: Surgical, Post-menopausal    Comment: Husband with vasectomy.  Other Topics Concern  . Not on file  Social History Narrative   Marital status: married x 29 years; happily married; no abuse.      Children: two daughters; three grandchildren.        Employment:  CMA; unemployed in 2019      Tobacco:  1-1.25  ppd x 20 years.      Alcohol: two to three drinks per month      Drugs: none      Exercise: daily; videos; floor exercises; situps.  Jogging during lunch in 2017.        Seatbelt:  100%; no texting      Guns:  Loaded; secured.   Family History  Problem Relation Age of Onset  . Diabetes Mother   . Hypertension Mother   . Dementia Mother   . Hyperlipidemia Father   . Cancer Father        prostate  . Stroke Father        after prostate cancer surgery with post-op pneumonia.  Marland Kitchen COPD  Father   . Aortic stenosis Sister   . Hypertension Sister   . Early death Sister   . Hyperlipidemia Brother   . Hypertension Brother   . Diabetes Brother   . Hyperlipidemia Brother   . Arthritis Brother   . Hypertension Brother   . Mental illness Brother        anxiety  . Stroke Brother        multiple TIAs.  . Breast cancer Neg Hx        Objective:    BP 122/68   Pulse 85   Temp 98 F (36.7 C) (Oral)   Resp 16   Ht 5' 2.99" (1.6 m)   Wt 181 lb (82.1 kg)   LMP 08/17/2013 Comment: denies preg  SpO2 95%   BMI 32.07 kg/m  Physical Exam  Constitutional: She is oriented to person, place, and time. She appears well-developed and well-nourished. No distress.  HENT:  Head: Normocephalic and atraumatic.  Right Ear: Hearing, tympanic membrane, external ear and ear canal normal.  Left  Ear: Hearing, tympanic membrane, external ear and ear canal normal.  Nose: Nose normal.  Mouth/Throat: Oropharynx is clear and moist.  Eyes: Conjunctivae and EOM are normal. Pupils are equal, round, and reactive to light.  Neck: Normal range of motion and full passive range of motion without pain. Neck supple. No JVD present. Carotid bruit is not present. No thyromegaly present.  Cardiovascular: Normal rate, regular rhythm, normal heart sounds and intact distal pulses. Exam reveals no gallop and no friction rub.  No murmur heard. Pulmonary/Chest: Effort normal and breath sounds normal. No respiratory distress. She has no wheezes. She has no rales. Right breast exhibits no inverted nipple, no mass, no nipple discharge, no skin change and no tenderness. Left breast exhibits no inverted nipple, no mass, no nipple discharge, no skin change and no tenderness. Breasts are symmetrical.  Abdominal: Soft. Bowel sounds are normal. She exhibits no distension and no mass. There is no tenderness. There is no rebound and no guarding.  Genitourinary: Penis normal, vagina normal and uterus normal. There is no rash,  tenderness or lesion on the right labia. There is no rash, tenderness or lesion on the left labia. Cervix exhibits no motion tenderness, no discharge and no friability. Right adnexum displays no mass, no tenderness and no fullness. Left adnexum displays no mass, no tenderness and no fullness.  Musculoskeletal:       Right shoulder: Normal.       Left shoulder: Normal.       Cervical back: Normal.  Lymphadenopathy:    She has no cervical adenopathy.  Neurological: She is alert and oriented to person, place, and time. She has normal reflexes. No cranial nerve deficit. She exhibits normal muscle tone. Coordination normal.  Skin: Skin is warm and dry. No rash noted. She is not diaphoretic. No erythema. No pallor.  Psychiatric: She has a normal mood and affect. Her behavior is normal. Judgment and thought content normal.  Nursing note and vitals reviewed.  No results found. Depression screen [facility] 2/9 08/01/2017 06/15/2017 05/11/2017 05/18/2016 03/10/2016  Decreased Interest 0 0 0 0 1  Down, Depressed, Hopeless 0 0 0 0 3  PHQ - 2 Score 0 0 0 0 4  Altered sleeping - - - - 2  Tired, decreased energy - - - - 2  Change in appetite - - - - 1  Feeling bad or failure about yourself  - - - - 2  Trouble concentrating - - - - 0  Moving slowly or fidgety/restless - - - - 0  Suicidal thoughts - - - - 0  PHQ-9 Score - - - - 11  Difficult doing work/chores - - - - Not difficult at all   Fall Risk  08/01/2017 06/15/2017 05/11/2017 03/10/2016 12/02/2015  Falls in the past year? No No No No No        Assessment & Plan:   1. Routine physical examination   2. Gastroesophageal reflux disease without esophagitis   3. Tobacco abuse   4. Anxiety   5. Caregiver stress syndrome   6. Screening for diabetes mellitus   7. Screening, lipid   8. Colon cancer screening   9. Encounter for gynecological examination without abnormal finding   10. Class 1 obesity due to excess calories without serious comorbidity with body  mass index (BMI) of 32.0 to 32.9 in adult     -anticipatory guidance provided --- exercise, weight loss, safe driving practices, aspirin 81mg  daily. -obtain age appropriate screening labs and labs  for chronic disease management. -Smoking cessation highly encouraged. -suffering with acute lower back pain with radicular symptoms.  Going for second opinion next week.  Continues to suffer with significant pain; will continue hydrocodone until upcoming consultation. -recommend weight loss, exercise for 30-60 minutes five days per week once acute back pain improves; recommend 1200 kcal restriction per day with a minimum of 60 grams of protein per day. 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    Orders Placed This Encounter  Procedures  . CBC with Differential/Platelet  . Comprehensive metabolic panel    Order Specific Question:   Has the patient fasted?    Answer:   No  . Hemoglobin A1c  . Lipid panel    Order Specific Question:   Has the patient fasted?    Answer:   No  . TSH  . HIV antibody  . POCT urinalysis dipstick  . EKG 12-Lead   No orders of the defined types were placed in this encounter.   Return in about 1 year (around 08/01/2018) for complete physical examiniation.   Margert Edsall Paulita Fujita, M.D. Primary Care at [facility] previously [facility] [address] [place], [place]  [address] [phone] phone [phone] fax

## 2017-07-29 ENCOUNTER — Telehealth: Payer: Self-pay | Admitting: Family Medicine

## 2017-07-29 MED ORDER — HYDROCODONE-ACETAMINOPHEN 7.5-325 MG PO TABS: 1.0000 | ORAL_TABLET | Freq: Four times a day (QID) | ORAL | 0 refills | Status: DC | PRN

## 2017-07-29 NOTE — Telephone Encounter (Signed)
Patient calling to request refill of hydrocodone for back pain.  Last refill 07/13/17.  Refill approved.

## 2017-08-01 ENCOUNTER — Ambulatory Visit (INDEPENDENT_AMBULATORY_CARE_PROVIDER_SITE_OTHER): Payer: BLUE CROSS/BLUE SHIELD | Admitting: Family Medicine

## 2017-08-01 ENCOUNTER — Other Ambulatory Visit: Payer: Self-pay

## 2017-08-01 ENCOUNTER — Encounter: Payer: Self-pay | Admitting: Family Medicine

## 2017-08-01 VITALS — BP 122/68 | HR 85 | Temp 98.0°F | Resp 16 | Ht 62.99 in | Wt 181.0 lb

## 2017-08-01 DIAGNOSIS — Z01419 Encounter for gynecological examination (general) (routine) without abnormal findings: Secondary | ICD-10-CM

## 2017-08-01 DIAGNOSIS — F419 Anxiety disorder, unspecified: Secondary | ICD-10-CM | POA: Diagnosis not present

## 2017-08-01 DIAGNOSIS — Z131 Encounter for screening for diabetes mellitus: Secondary | ICD-10-CM

## 2017-08-01 DIAGNOSIS — Z114 Encounter for screening for human immunodeficiency virus [HIV]: Secondary | ICD-10-CM | POA: Diagnosis not present

## 2017-08-01 DIAGNOSIS — E6609 Other obesity due to excess calories: Secondary | ICD-10-CM

## 2017-08-01 DIAGNOSIS — F4389 Other reactions to severe stress: Secondary | ICD-10-CM

## 2017-08-01 DIAGNOSIS — Z0000 Encounter for general adult medical examination without abnormal findings: Secondary | ICD-10-CM

## 2017-08-01 DIAGNOSIS — K219 Gastro-esophageal reflux disease without esophagitis: Secondary | ICD-10-CM

## 2017-08-01 DIAGNOSIS — Z13220 Encounter for screening for lipoid disorders: Secondary | ICD-10-CM | POA: Diagnosis not present

## 2017-08-01 DIAGNOSIS — Z6832 Body mass index (BMI) 32.0-32.9, adult: Secondary | ICD-10-CM | POA: Diagnosis not present

## 2017-08-01 DIAGNOSIS — Z720 Tobacco use: Secondary | ICD-10-CM

## 2017-08-01 DIAGNOSIS — R457 State of emotional shock and stress, unspecified: Secondary | ICD-10-CM | POA: Diagnosis not present

## 2017-08-01 DIAGNOSIS — Z1211 Encounter for screening for malignant neoplasm of colon: Secondary | ICD-10-CM | POA: Diagnosis not present

## 2017-08-01 LAB — POCT URINALYSIS DIP (MANUAL ENTRY)
Bilirubin, UA: NEGATIVE
GLUCOSE UA: NEGATIVE mg/dL
Ketones, POC UA: NEGATIVE mg/dL
Leukocytes, UA: NEGATIVE
NITRITE UA: NEGATIVE
PH UA: 7 (ref 5.0–8.0)
Protein Ur, POC: NEGATIVE mg/dL
SPEC GRAV UA: 1.02 (ref 1.010–1.025)
UROBILINOGEN UA: 0.2 U/dL

## 2017-08-01 NOTE — Patient Instructions (Addendum)
IF you received an x-ray today, you will receive an invoice from [facility]. Please contact [facility] at [phone] with questions or concerns regarding your invoice.   IF you received labwork today, you will receive an invoice from [facility]. Please contact [facility] at [phone] with questions or concerns regarding your invoice.   Our billing staff will not be able to assist you with questions regarding bills from these companies.  You will be contacted with the lab results as soon as they are available. The fastest way to get your results is to activate your My Chart account. Instructions are located on the last page of this paperwork. If you have not heard from [place] regarding the results in 2 weeks, please contact this office.      Preventive Care 40-64 Years, Female Preventive care refers to lifestyle choices and visits with your health care provider that can promote health and wellness. What does preventive care include?  A yearly physical exam. This is also called an annual well check.  Dental exams once or twice a year.  Routine eye exams. Ask your health care provider how often you should have your eyes checked.  Personal lifestyle choices, including: ? Daily care of your teeth and gums. ? Regular physical activity. ? Eating a healthy diet. ? Avoiding tobacco and drug use. ? Limiting alcohol use. ? Practicing safe sex. ? Taking low-dose aspirin daily starting at age 34. ? Taking vitamin and mineral supplements as recommended by your health care provider. What happens during an annual well check? The services and screenings done by your health care provider during your annual well check will depend on your age, overall health, lifestyle risk factors, and family history of disease. Counseling Your health care provider may ask you questions about your:  Alcohol use.  Tobacco use.  Drug use.  Emotional well-being.  Home and relationship  well-being.  Sexual activity.  Eating habits.  Work and work Statistician.  Method of birth control.  Menstrual cycle.  Pregnancy history.  Screening You may have the following tests or measurements:  Height, weight, and BMI.  Blood pressure.  Lipid and cholesterol levels. These may be checked every 5 years, or more frequently if you are over 65 years old.  Skin check.  Lung cancer screening. You may have this screening every year starting at age 102 if you have a 30-pack-year history of smoking and currently smoke or have quit within the past 15 years.  Fecal occult blood test (FOBT) of the stool. You may have this test every year starting at age 33.  Flexible sigmoidoscopy or colonoscopy. You may have a sigmoidoscopy every 5 years or a colonoscopy every 10 years starting at age 23.  Hepatitis C blood test.  Hepatitis B blood test.  Sexually transmitted disease (STD) testing.  Diabetes screening. This is done by checking your blood sugar (glucose) after you have not eaten for a while (fasting). You may have this done every 1-3 years.  Mammogram. This may be done every 1-2 years. Talk to your health care provider about when you should start having regular mammograms. This may depend on whether you have a family history of breast cancer.  BRCA-related cancer screening. This may be done if you have a family history of breast, ovarian, tubal, or peritoneal cancers.  Pelvic exam and Pap test. This may be done every 3 years starting at age 3. Starting at age 55, this may be done every 5 years if  you have a Pap test in combination with an HPV test.  Bone density scan. This is done to screen for osteoporosis. You may have this scan if you are at high risk for osteoporosis.  Discuss your test results, treatment options, and if necessary, the need for more tests with your health care provider. Vaccines Your health care provider may recommend certain vaccines, such  as:  Influenza vaccine. This is recommended every year.  Tetanus, diphtheria, and acellular pertussis (Tdap, Td) vaccine. You may need a Td booster every 10 years.  Varicella vaccine. You may need this if you have not been vaccinated.  Zoster vaccine. You may need this after age 77.  Measles, mumps, and rubella (MMR) vaccine. You may need at least one dose of MMR if you were born in 1957 or later. You may also need a second dose.  Pneumococcal 13-valent conjugate (PCV13) vaccine. You may need this if you have certain conditions and were not previously vaccinated.  Pneumococcal polysaccharide (PPSV23) vaccine. You may need one or two doses if you smoke cigarettes or if you have certain conditions.  Meningococcal vaccine. You may need this if you have certain conditions.  Hepatitis A vaccine. You may need this if you have certain conditions or if you travel or work in places where you may be exposed to hepatitis A.  Hepatitis B vaccine. You may need this if you have certain conditions or if you travel or work in places where you may be exposed to hepatitis B.  Haemophilus influenzae type b (Hib) vaccine. You may need this if you have certain conditions.  Talk to your health care provider about which screenings and vaccines you need and how often you need them. This information is not intended to replace advice given to you by your health care provider. Make sure you discuss any questions you have with your health care provider. Document Released: 06/27/2015 Document Revised: 02/18/2016 Document Reviewed: 04/01/2015 Elsevier Interactive Patient Education  [employer].

## 2017-08-02 LAB — CBC WITH DIFFERENTIAL/PLATELET
BASOS ABS: 0 10*3/uL (ref 0.0–0.2)
BASOS: 0 %
EOS (ABSOLUTE): 0.1 10*3/uL (ref 0.0–0.4)
Eos: 1 %
HEMOGLOBIN: 15.8 g/dL (ref 11.1–15.9)
Hematocrit: 48.2 % — ABNORMAL HIGH (ref 34.0–46.6)
Immature Grans (Abs): 0 10*3/uL (ref 0.0–0.1)
Immature Granulocytes: 0 %
LYMPHS ABS: 2.4 10*3/uL (ref 0.7–3.1)
Lymphs: 24 %
MCH: 32 pg (ref 26.6–33.0)
MCHC: 32.8 g/dL (ref 31.5–35.7)
MCV: 98 fL — AB (ref 79–97)
MONOCYTES: 7 %
Monocytes Absolute: 0.7 10*3/uL (ref 0.1–0.9)
NEUTROS ABS: 6.8 10*3/uL (ref 1.4–7.0)
Neutrophils: 68 %
Platelets: 338 10*3/uL (ref 150–379)
RBC: 4.94 x10E6/uL (ref 3.77–5.28)
RDW: 13.5 % (ref 12.3–15.4)
WBC: 10 10*3/uL (ref 3.4–10.8)

## 2017-08-02 LAB — COMPREHENSIVE METABOLIC PANEL
A/G RATIO: 1.8 (ref 1.2–2.2)
ALBUMIN: 4.5 g/dL (ref 3.5–5.5)
ALK PHOS: 106 IU/L (ref 39–117)
ALT: 20 IU/L (ref 0–32)
AST: 19 IU/L (ref 0–40)
BILIRUBIN TOTAL: 0.3 mg/dL (ref 0.0–1.2)
BUN / CREAT RATIO: 17 (ref 9–23)
BUN: 12 mg/dL (ref 6–24)
CO2: 26 mmol/L (ref 20–29)
Calcium: 10 mg/dL (ref 8.7–10.2)
Chloride: 101 mmol/L (ref 96–106)
Creatinine, Ser: 0.7 mg/dL (ref 0.57–1.00)
GFR calc non Af Amer: 101 mL/min/{1.73_m2} (ref >59)
GFR, EST AFRICAN AMERICAN: 117 mL/min/{1.73_m2} (ref >59)
GLOBULIN, TOTAL: 2.5 g/dL (ref 1.5–4.5)
GLUCOSE: 106 mg/dL — AB (ref 65–99)
POTASSIUM: 4.6 mmol/L (ref 3.5–5.2)
SODIUM: 143 mmol/L (ref 134–144)
TOTAL PROTEIN: 7 g/dL (ref 6.0–8.5)

## 2017-08-02 LAB — LIPID PANEL
Chol/HDL Ratio: 3.7 ratio (ref 0.0–4.4)
Cholesterol, Total: 245 mg/dL — ABNORMAL HIGH (ref 100–199)
HDL: 66 mg/dL (ref >39)
LDL CALC: 154 mg/dL — AB (ref 0–99)
Triglycerides: 127 mg/dL (ref 0–149)
VLDL Cholesterol Cal: 25 mg/dL (ref 5–40)

## 2017-08-02 LAB — HIV ANTIBODY (ROUTINE TESTING W REFLEX): HIV Screen 4th Generation wRfx: NONREACTIVE

## 2017-08-02 LAB — TSH: TSH: 1.83 u[IU]/mL (ref 0.450–4.500)

## 2017-08-02 LAB — HEMOGLOBIN A1C
Est. average glucose Bld gHb Est-mCnc: 117 mg/dL
HEMOGLOBIN A1C: 5.7 % — AB (ref 4.8–5.6)

## 2017-08-03 LAB — PAP IG AND HPV HIGH-RISK
HPV, HIGH-RISK: NEGATIVE
PAP SMEAR COMMENT: 0

## 2017-08-04 DIAGNOSIS — M4716 Other spondylosis with myelopathy, lumbar region: Secondary | ICD-10-CM | POA: Diagnosis not present

## 2017-08-04 DIAGNOSIS — M5136 Other intervertebral disc degeneration, lumbar region: Secondary | ICD-10-CM | POA: Diagnosis not present

## 2017-08-04 DIAGNOSIS — M549 Dorsalgia, unspecified: Secondary | ICD-10-CM | POA: Diagnosis not present

## 2017-08-09 ENCOUNTER — Encounter: Payer: Self-pay | Admitting: Family Medicine

## 2017-08-16 ENCOUNTER — Telehealth: Payer: Self-pay | Admitting: Family Medicine

## 2017-08-16 MED ORDER — HYDROCODONE-ACETAMINOPHEN 7.5-325 MG PO TABS: 1.0000 | ORAL_TABLET | Freq: Four times a day (QID) | ORAL | 0 refills | Status: DC | PRN

## 2017-08-16 NOTE — Telephone Encounter (Signed)
Last fill of hydrocodone 07/29/17.  [place] Controlled Substance Registry reviewed; no other fills.

## 2017-08-22 ENCOUNTER — Other Ambulatory Visit: Payer: Self-pay

## 2017-08-22 ENCOUNTER — Encounter: Payer: Self-pay | Admitting: Family Medicine

## 2017-08-22 ENCOUNTER — Ambulatory Visit: Payer: BLUE CROSS/BLUE SHIELD | Admitting: Family Medicine

## 2017-08-22 VITALS — BP 122/78 | HR 99 | Temp 98.0°F | Resp 16 | Ht 62.0 in | Wt 179.0 lb

## 2017-08-22 DIAGNOSIS — M5136 Other intervertebral disc degeneration, lumbar region: Secondary | ICD-10-CM | POA: Diagnosis not present

## 2017-08-22 DIAGNOSIS — M7989 Other specified soft tissue disorders: Secondary | ICD-10-CM | POA: Diagnosis not present

## 2017-08-22 DIAGNOSIS — S139XXD Sprain of joints and ligaments of unspecified parts of neck, subsequent encounter: Secondary | ICD-10-CM

## 2017-08-22 DIAGNOSIS — M5137 Other intervertebral disc degeneration, lumbosacral region: Secondary | ICD-10-CM

## 2017-08-22 MED ORDER — HYDROCODONE-ACETAMINOPHEN 7.5-325 MG PO TABS: 1.0000 | ORAL_TABLET | Freq: Four times a day (QID) | ORAL | 0 refills | Status: DC | PRN

## 2017-08-22 NOTE — Patient Instructions (Signed)
     IF you received an x-ray today, you will receive an invoice from [facility]. Please contact [facility] at [phone] with questions or concerns regarding your invoice.   IF you received labwork today, you will receive an invoice from [facility]. Please contact [facility] at [phone] with questions or concerns regarding your invoice.   Our billing staff will not be able to assist you with questions regarding bills from these companies.  You will be contacted with the lab results as soon as they are available. The fastest way to get your results is to activate your My Chart account. Instructions are located on the last page of this paperwork. If you have not heard from us regarding the results in 2 weeks, please contact this office.     

## 2017-08-22 NOTE — Progress Notes (Signed)
Subjective:    Patient ID: Nichole Klein, female    DOB: 19-May-1966, 52 y.o.   MRN: [id number]  08/22/2017  Back Pain (follow-up pt states her back in not any better )    HPI This 52 y.o. female presents for evaluation of lower back pain.  Saw Dr. Noel Gerold; recommended Dr. Katrinka Blazing manage medication.   Suggested adding Lyrica if wants to.  Having excess radicular pain.  Remembers that ten years ago, having L radicular symptoms; L foot numbness; this is pins and needles in both feet and up to anterior shins.  Now R leg is hurting and limping. Laying on heating pad a lot.  No warmth in R leg; mild swelling on R leg; not hot. Worried about DVT.  Intermittent pain.  First thing in morning, pain is there.  With pressure, more pain in leg; thus, limping.Going for injection on Monday; different type of injection; to deaden the nerve joint; should know immediately if beneficial. If no benefit, will warrant surgery.  Will warrant metal plate in back.  Hesitant about surgery.  Recommend surgery of lower back.  Surgery will be six weeks out.  Having horrible headaches at occipital ridge.  Wants MRI cervical spine.  Middle of neck is killing patient; worse than lower back.  Stopped muscle relaxer from [employer]; has Flexeril.  Radiating into shoulders B tingling; hands in first and second fingers B.  Lower back pain and middle back pain are equal; 8-9/10.  On medication, 7/10.  Has Meloxicam at home; not beneficial.  Took Naproxen for months; yet stopped because of three month duration.   Tuesday has mediation with [facility].  Refusing to sign up for long term disability paperwork.   S/p two injections in the past.    BP Readings from Last 3 Encounters:  08/22/17 122/78  08/01/17 122/68  06/15/17 122/72   Wt Readings from Last 3 Encounters:  08/22/17 179 lb (81.2 kg)  08/01/17 181 lb (82.1 kg)  06/15/17 180 lb (81.6 kg)   Immunization History  Administered Date(s) Administered  .  Influenza Split 05/15/2012, 04/07/2013  . Influenza-Unspecified 06/11/2014, 03/03/2016, 03/26/2017  . Pneumococcal Polysaccharide-23 07/10/2012  . Tdap 06/14/2009    Review of Systems  Constitutional: Negative for chills, diaphoresis, fatigue and fever.  Eyes: Negative for visual disturbance.  Respiratory: Negative for cough and shortness of breath.   Cardiovascular: Negative for chest pain, palpitations and leg swelling.  Gastrointestinal: Negative for abdominal pain, constipation, diarrhea, nausea and vomiting.  Endocrine: Negative for cold intolerance, heat intolerance, polydipsia, polyphagia and polyuria.  Musculoskeletal: Positive for back pain and gait problem.  Neurological: Negative for dizziness, tremors, seizures, syncope, facial asymmetry, speech difficulty, weakness, light-headedness, numbness and headaches.    Past Medical History:  Diagnosis Date  . Anxiety   . Arthritis    DDD lumbar  . Hematuria 06/15/2007   s/p urology consultation [name]; s/p CT scan negative.  No cystoscopy.  Marland Kitchen Herpes labialis    Past Surgical History:  Procedure Laterality Date  . NOSE SURGERY    . SPINE SURGERY  06/14/2008   Lumbar surgery L5-S1.  [place].   No Known Allergies Current Outpatient Medications on File Prior to Visit  Medication Sig Dispense Refill  . ALPRAZolam (XANAX) 0.5 MG tablet Take 1 tablet (0.5 mg total) by mouth at bedtime as needed for anxiety. 30 tablet 1  . ranitidine (ZANTAC) 150 MG tablet Take 150 mg by mouth 2 (two) times daily.  No current facility-administered medications on file prior to visit.    Social History   Socioeconomic History  . Marital status: Married    Spouse name: Remi DeterSamuel  . Number of children: 2  . Years of education: 4815  . Highest education level: Not on file  Social Needs  . Financial resource strain: Not on file  . Food insecurity - worry: Not on file  . Food insecurity - inability: Not on file  . Transportation needs - medical:  Not on file  . Transportation needs - non-medical: Not on file  Occupational History  . Occupation: unemployed    Comment: CMA  Tobacco Use  . Smoking status: Current Every Day Smoker    Packs/day: 1.50    Years: 25.00    Pack years: 37.50  . Smokeless tobacco: Never Used  Substance and Sexual Activity  . Alcohol use: No    Comment: special occasions only.  . Drug use: No  . Sexual activity: Yes    Birth control/protection: Surgical, Post-menopausal    Comment: Husband with vasectomy.  Other Topics Concern  . Not on file  Social History Narrative   Marital status: married x 29 years; happily married; no abuse.      Children: two daughters; three grandchildren.        Employment:  CMA; unemployed in 2019      Tobacco:  1-1.25  ppd x 20 years.      Alcohol: two to three drinks per month      Drugs: none      Exercise: daily; videos; floor exercises; situps.  Jogging during lunch in 2017.        Seatbelt:  100%; no texting      Guns:  Loaded; secured.   Family History  Problem Relation Age of Onset  . Diabetes Mother   . Hypertension Mother   . Dementia Mother   . Hyperlipidemia Father   . Cancer Father        prostate  . Stroke Father        after prostate cancer surgery with post-op pneumonia.  Marland Kitchen. COPD Father   . Aortic stenosis Sister   . Hypertension Sister   . Early death Sister   . Hyperlipidemia Brother   . Hypertension Brother   . Diabetes Brother   . Hyperlipidemia Brother   . Arthritis Brother   . Hypertension Brother   . Mental illness Brother        anxiety  . Stroke Brother        multiple TIAs.  . Breast cancer Neg Hx        Objective:    BP 122/78   Pulse 99   Temp 98 F (36.7 C) (Oral)   Resp 16   Ht 5\' 2"  (1.575 m)   Wt 179 lb (81.2 kg)   LMP 08/17/2013 Comment: denies preg  SpO2 98%   BMI 32.74 kg/m  Physical Exam  Constitutional: She is oriented to person, place, and time. She appears well-developed and well-nourished. No  distress.  HENT:  Head: Normocephalic and atraumatic.  Eyes: Conjunctivae and EOM are normal. Pupils are equal, round, and reactive to light.  Neck: Normal range of motion. Neck supple. Carotid bruit is not present.  Cardiovascular: Normal rate, regular rhythm, normal heart sounds and intact distal pulses. Exam reveals no gallop and no friction rub.  No murmur heard. R lower leg with mild edema; Hommen's negative.  Pulmonary/Chest: Effort normal and breath sounds normal.  She has no wheezes. She has no rales.  Musculoskeletal: She exhibits edema.  Neurological: She is alert and oriented to person, place, and time. No cranial nerve deficit. Coordination normal.  Skin: Skin is warm and dry. No rash noted. She is not diaphoretic. No erythema. No pallor.  Psychiatric: She has a normal mood and affect. Her behavior is normal. Judgment and thought content normal.   No results found. Depression screen [facility] 2/9 08/22/2017 08/01/2017 06/15/2017 05/11/2017 05/18/2016  Decreased Interest 0 0 0 0 0  Down, Depressed, Hopeless 0 0 0 0 0  PHQ - 2 Score 0 0 0 0 0  Altered sleeping - - - - -  Tired, decreased energy - - - - -  Change in appetite - - - - -  Feeling bad or failure about yourself  - - - - -  Trouble concentrating - - - - -  Moving slowly or fidgety/restless - - - - -  Suicidal thoughts - - - - -  PHQ-9 Score - - - - -  Difficult doing work/chores - - - - -   Fall Risk  08/22/2017 08/01/2017 06/15/2017 05/11/2017 03/10/2016  Falls in the past year? No No No No No        Assessment & Plan:   1. Degenerative disc disease at L5-S1 level   2. Cervical sprain, subsequent encounter   3. Right leg swelling     DDD lumbar spine: s/p second opinion regarding acute lower back pain.  Refill of hydrocodone provided. Cervical sprain: persistent and moderate in severity.  Hydrocodone for pain as well.  S/p consultation second opinion.  Obtain UDS per CDC guidelines. R leg swelling: New onset; send for  lower extremity doppler due to recent sedentary lifestyle due to acute pain.   Orders Placed This Encounter  Procedures  . Pain Management Screening Profile (10S)   Meds ordered this encounter  Medications  . HYDROcodone-acetaminophen (NORCO) 7.5-325 MG tablet    Sig: Take 1 tablet by mouth every 6 (six) hours as needed for moderate pain.    Dispense:  60 tablet    Refill:  0    Do not fill until 08-29-17.    Return in about 6 weeks (around 10/03/2017).   Abigaelle Verley Paulita Fujita, M.D. Primary Care at [facility] previously [facility] [address] [place], [place]  [address] [phone] phone [phone] fax

## 2017-08-23 ENCOUNTER — Ambulatory Visit (INDEPENDENT_AMBULATORY_CARE_PROVIDER_SITE_OTHER): Payer: BLUE CROSS/BLUE SHIELD

## 2017-08-23 DIAGNOSIS — M7989 Other specified soft tissue disorders: Secondary | ICD-10-CM

## 2017-08-23 LAB — PMP SCREEN PROFILE (10S), URINE
AMPHETAMINE SCREEN URINE: NEGATIVE ng/mL
BARBITURATE SCREEN URINE: NEGATIVE ng/mL
BENZODIAZEPINE SCREEN, URINE: NEGATIVE ng/mL
CANNABINOIDS UR QL SCN: NEGATIVE ng/mL
CREATININE(CRT), U: 68.6 mg/dL (ref 20.0–300.0)
Cocaine (Metab) Scrn, Ur: NEGATIVE ng/mL
METHADONE SCREEN, URINE: NEGATIVE ng/mL
OXYCODONE+OXYMORPHONE UR QL SCN: NEGATIVE ng/mL
Opiate Scrn, Ur: POSITIVE ng/mL — AB
PH UR, DRUG SCRN: 5.4 (ref 4.5–8.9)
PHENCYCLIDINE QUANTITATIVE URINE: NEGATIVE ng/mL
PROPOXYPHENE SCREEN URINE: NEGATIVE ng/mL

## 2017-08-29 DIAGNOSIS — M47816 Spondylosis without myelopathy or radiculopathy, lumbar region: Secondary | ICD-10-CM | POA: Diagnosis not present

## 2017-08-29 DIAGNOSIS — M5136 Other intervertebral disc degeneration, lumbar region: Secondary | ICD-10-CM | POA: Diagnosis not present

## 2017-09-01 DIAGNOSIS — M545 Low back pain: Secondary | ICD-10-CM | POA: Diagnosis not present

## 2017-09-01 DIAGNOSIS — M4716 Other spondylosis with myelopathy, lumbar region: Secondary | ICD-10-CM | POA: Diagnosis not present

## 2017-09-01 DIAGNOSIS — M5136 Other intervertebral disc degeneration, lumbar region: Secondary | ICD-10-CM | POA: Diagnosis not present

## 2017-09-13 ENCOUNTER — Telehealth: Payer: Self-pay | Admitting: Family Medicine

## 2017-09-13 MED ORDER — HYDROCODONE-ACETAMINOPHEN 7.5-325 MG PO TABS: 1.0000 | ORAL_TABLET | Freq: Four times a day (QID) | ORAL | 0 refills | Status: DC | PRN

## 2017-09-13 NOTE — Telephone Encounter (Signed)
Patient will be due for hydrocodone refill on 09/14/17.  Updating provider that she will be having back surgery for current symptoms. Has follow-up appointment in upcoming month.

## 2017-09-30 ENCOUNTER — Telehealth: Payer: Self-pay | Admitting: Family Medicine

## 2017-09-30 MED ORDER — HYDROCODONE-ACETAMINOPHEN 7.5-325 MG PO TABS: 1.0000 | ORAL_TABLET | Freq: Four times a day (QID) | ORAL | 0 refills | Status: DC | PRN

## 2017-09-30 NOTE — Telephone Encounter (Signed)
Patient requesting refill of hydrocodone. Last filled 09/13/17.

## 2017-10-10 ENCOUNTER — Encounter: Payer: Self-pay | Admitting: Family Medicine

## 2017-10-10 ENCOUNTER — Other Ambulatory Visit: Payer: Self-pay

## 2017-10-10 ENCOUNTER — Ambulatory Visit: Payer: BLUE CROSS/BLUE SHIELD | Admitting: Family Medicine

## 2017-10-10 VITALS — BP 102/60 | HR 66 | Ht 62.0 in | Wt 169.1 lb

## 2017-10-10 DIAGNOSIS — M542 Cervicalgia: Secondary | ICD-10-CM | POA: Diagnosis not present

## 2017-10-10 DIAGNOSIS — M5136 Other intervertebral disc degeneration, lumbar region: Secondary | ICD-10-CM

## 2017-10-10 DIAGNOSIS — J301 Allergic rhinitis due to pollen: Secondary | ICD-10-CM | POA: Diagnosis not present

## 2017-10-10 MED ORDER — HYDROCODONE-ACETAMINOPHEN 7.5-325 MG PO TABS: 1.0000 | ORAL_TABLET | Freq: Four times a day (QID) | ORAL | 0 refills | Status: DC | PRN

## 2017-10-10 MED ORDER — FLUTICASONE PROPIONATE 50 MCG/ACT NA SUSP: 2.0000 | Freq: Every day | NASAL | 6 refills | Status: AC

## 2017-10-10 NOTE — Progress Notes (Signed)
Subjective:    Patient ID: Nichole Klein, female    DOB: July 15, 1965, 52 y.o.   MRN: [id number]  10/10/2017  chonric conditions (follow up)    HPI This 52 y.o. female presents for FOLLOW-UP of lower back pain.   Surgery scheduled for December 19, 2017.  Must pay for surgery before will perform.  Middle back pain radiates into neck.  Occipital region severely painful. Wakes up with stinging pain.  Almost locked for a few minutes.  Able to get it moving.  When first wakes up, severe stiffness.  So sleepy all the time; cannot sleep due to neck pain.  Smoking more because getting up more.  Walks around at night. Gets back in bed to try to sleep.  Muscle relaxer did not help with back pain.  Orthopedist advised to take two muscle relaxers at night; still up every two hours.  After had fusion last, developed headache after that.  Head is extremely hurting.  Saw NS for surgery discussion; gave shot of Toradol in office; went to sleep; still had a headache.  The next day, always has headache upon awakening.  When gets up, headache will ease off.  Took prednisone taper in October 2018; does not tolerate well.  No blurred vision; numbness and tingling in bilateral first and second fingers; especially bad upon awakening.  Neck pain has worsened; would not discuss neck at this time; has been focusing on lower back.  Insurance has come back; putting a lean on patient; now insurance is questioning claim; workman's compensation has denied claim.  Insurance company is calling.  Tingling in shoulders intermittent.  Tingling in feet constant since October 2018.  Has Meloxicam; has Flexeril.    Allergic rhinitis: suffering with horrible allergies; soreness of throat upon awakening. Nasal congestion and dryness.  Will not take Claritin.     BP Readings from Last 3 Encounters:  10/10/17 102/60  08/22/17 122/78  08/01/17 122/68   Wt Readings from Last 3 Encounters:  10/10/17 169 lb 1.6 oz (76.7 kg)  08/22/17  179 lb (81.2 kg)  08/01/17 181 lb (82.1 kg)   Immunization History  Administered Date(s) Administered  . Influenza Split 05/15/2012, 04/07/2013  . Influenza-Unspecified 06/11/2014, 03/03/2016, 03/26/2017  . Pneumococcal Polysaccharide-23 07/10/2012  . Tdap 06/14/2009    Review of Systems  Constitutional: Negative for chills, diaphoresis, fatigue and fever.  HENT: Positive for congestion, postnasal drip, rhinorrhea, sneezing and sore throat.   Eyes: Negative for visual disturbance.  Respiratory: Negative for cough and shortness of breath.   Cardiovascular: Negative for chest pain, palpitations and leg swelling.  Gastrointestinal: Negative for abdominal pain, constipation, diarrhea, nausea and vomiting.  Endocrine: Negative for cold intolerance, heat intolerance, polydipsia, polyphagia and polyuria.  Genitourinary: Negative for decreased urine volume and difficulty urinating.  Musculoskeletal: Positive for back pain, myalgias, neck pain and neck stiffness.  Neurological: Positive for numbness. Negative for dizziness, tremors, seizures, syncope, facial asymmetry, speech difficulty, weakness, light-headedness and headaches.    Past Medical History:  Diagnosis Date  . Anxiety   . Arthritis    DDD lumbar  . Hematuria 06/15/2007   s/p urology consultation [name]; s/p CT scan negative.  No cystoscopy.  Marland Kitchen Herpes labialis    Past Surgical History:  Procedure Laterality Date  . NOSE SURGERY    . SPINE SURGERY  06/14/2008   Lumbar surgery L5-S1.  [place].   No Known Allergies Current Outpatient Medications on File Prior to Visit  Medication Sig Dispense Refill  .  ranitidine (ZANTAC) 150 MG tablet Take 150 mg by mouth 2 (two) times daily.     No current facility-administered medications on file prior to visit.    Social History   Socioeconomic History  . Marital status: Married    Spouse name: [name]  . Number of children: 2  . Years of education: 45  . Highest education level:  Not on file  Occupational History  . Occupation: unemployed    Comment: CMA  Social Needs  . Financial resource strain: Not on file  . Food insecurity:    Worry: Not on file    Inability: Not on file  . Transportation needs:    Medical: Not on file    Non-medical: Not on file  Tobacco Use  . Smoking status: Current Every Day Smoker    Packs/day: 1.50    Years: 25.00    Pack years: 37.50  . Smokeless tobacco: Never Used  Substance and Sexual Activity  . Alcohol use: No    Comment: special occasions only.  . Drug use: No  . Sexual activity: Yes    Birth control/protection: Surgical, Post-menopausal    Comment: Husband with vasectomy.  Lifestyle  . Physical activity:    Days per week: 0 days    Minutes per session: Not on file  . Stress: Not on file  Relationships  . Social connections:    Talks on phone: Not on file    Gets together: Not on file    Attends religious service: Not on file    Active member of club or organization: Not on file    Attends meetings of clubs or organizations: Not on file    Relationship status: Not on file  . Intimate partner violence:    Fear of current or ex partner: Not on file    Emotionally abused: Not on file    Physically abused: Not on file    Forced sexual activity: Not on file  Other Topics Concern  . Not on file  Social History Narrative   Marital status: married x 29 years; happily married; no abuse.      Children: two daughters; three grandchildren.        Employment:  CMA; unemployed in 2019      Tobacco:  1-1.25  ppd x 20 years.      Alcohol: two to three drinks per month      Drugs: none      Exercise: daily; videos; floor exercises; situps.  Jogging during lunch in 2017.        Seatbelt:  100%; no texting      Guns:  Loaded; secured.   Family History  Problem Relation Age of Onset  . Diabetes Mother   . Hypertension Mother   . Dementia Mother   . Hyperlipidemia Father   . Cancer Father        prostate  . Stroke  Father        after prostate cancer surgery with post-op pneumonia.  Marland Kitchen COPD Father   . Aortic stenosis Sister   . Hypertension Sister   . Early death Sister   . Hyperlipidemia Brother   . Hypertension Brother   . Diabetes Brother   . Hyperlipidemia Brother   . Arthritis Brother   . Hypertension Brother   . Mental illness Brother        anxiety  . Stroke Brother        multiple TIAs.  . Breast cancer Neg Hx  Objective:    BP 102/60 (BP Location: Right Arm, Patient Position: Sitting, Cuff Size: Normal)   Pulse 66   Ht  (1.575 m)   Wt 169 lb 1.6 oz (76.7 kg)   LMP 08/17/2013 Comment: denies preg  SpO2 98%   BMI 30.93 kg/m  Physical Exam  Constitutional: She is oriented to person, place, and time. She appears well-developed and well-nourished. No distress.  HENT:  Head: Normocephalic and atraumatic.  Right Ear: External ear normal.  Left Ear: External ear normal.  Nose: Nose normal.  Mouth/Throat: Oropharynx is clear and moist.  Eyes: Pupils are equal, round, and reactive to light. Conjunctivae and EOM are normal.  Neck: Normal range of motion. Neck supple. Carotid bruit is not present. No thyromegaly present.  Cardiovascular: Normal rate, regular rhythm, normal heart sounds and intact distal pulses. Exam reveals no gallop and no friction rub.  No murmur heard. Pulmonary/Chest: Effort normal and breath sounds normal. She has no wheezes. She has no rales.  Abdominal: Soft. Bowel sounds are normal. She exhibits no distension and no mass. There is no tenderness. There is no rebound and no guarding.  Lymphadenopathy:    She has no cervical adenopathy.  Neurological: She is alert and oriented to person, place, and time. No cranial nerve deficit.  Skin: Skin is warm and dry. No rash noted. She is not diaphoretic. No erythema. No pallor.  Psychiatric: She has a normal mood and affect. Her behavior is normal.   No results found. Depression screen [facility] 2/9 10/10/2017  08/22/2017 08/01/2017 06/15/2017 05/11/2017  Decreased Interest 0 0 0 0 0  Down, Depressed, Hopeless 0 0 0 0 0  PHQ - 2 Score 0 0 0 0 0  Altered sleeping - - - - -  Tired, decreased energy - - - - -  Change in appetite - - - - -  Feeling bad or failure about yourself  - - - - -  Trouble concentrating - - - - -  Moving slowly or fidgety/restless - - - - -  Suicidal thoughts - - - - -  PHQ-9 Score - - - - -  Difficult doing work/chores - - - - -   Fall Risk  10/10/2017 08/22/2017 08/01/2017 06/15/2017 05/11/2017  Falls in the past year? No No No No No        Assessment & Plan:   1. Lumbar degenerative disc disease   2. Neck pain   3. Seasonal allergic rhinitis due to pollen     Lumbar DDD with cervical neck sprain/pain: persistent; scheduled for surgery in July; refill of hydrocodone provided.  Call when needs refill.  Allergic rhinitis: uncontrolled; rx for Flonase provided.   No orders of the defined types were placed in this encounter.  Meds ordered this encounter  Medications  . HYDROcodone-acetaminophen (NORCO) 7.5-325 MG tablet    Sig: Take 1 tablet by mouth every 6 (six) hours as needed for moderate pain. DO NOT FILL UNTIL 10-13-2017    Dispense:  120 tablet    Refill:  0  . fluticasone (FLONASE) 50 MCG/ACT nasal spray    Sig: Place 2 sprays into both nostrils daily.    Dispense:  16 g    Refill:  6    Return in about 2 months (around 12/10/2017) for follow-up chronic medical conditions.   Kristi Paulita Fujita, M.D. Primary Care at [facility] previously [facility] [address] [place], [place]  [address] [phone]  [phone] phone [phone] fax

## 2017-10-10 NOTE — Patient Instructions (Addendum)
  For allergies, you can take CLARITIN  DAILY  And FLONASE 2 SPRAYS INTO EACH NOSTRIL DAILY; you can also use nasal saline spray 2 sprays into each nostril four times daily.   IF you received an x-ray today, you will receive an invoice from [facility]. Please contact [facility] at [phone] with questions or concerns regarding your invoice.   IF you received labwork today, you will receive an invoice from [facility]. Please contact [facility] at [phone] with questions or concerns regarding your invoice.   Our billing staff will not be able to assist you with questions regarding bills from these companies.  You will be contacted with the lab results as soon as they are available. The fastest way to get your results is to activate your My Chart account. Instructions are located on the last page of this paperwork. If you have not heard from [place] regarding the results in 2 weeks, please contact this office.    [facility]

## 2017-11-03 ENCOUNTER — Encounter: Payer: Self-pay | Admitting: Family Medicine

## 2017-11-03 DIAGNOSIS — Z0271 Encounter for disability determination: Secondary | ICD-10-CM

## 2017-11-16 ENCOUNTER — Telehealth: Payer: Self-pay | Admitting: *Deleted

## 2017-11-16 NOTE — Telephone Encounter (Signed)
Successful fax to [phone]

## 2017-11-21 ENCOUNTER — Encounter: Payer: Self-pay | Admitting: Family Medicine

## 2017-11-21 ENCOUNTER — Ambulatory Visit: Payer: BLUE CROSS/BLUE SHIELD | Admitting: Family Medicine

## 2017-11-21 ENCOUNTER — Other Ambulatory Visit: Payer: Self-pay

## 2017-11-21 VITALS — BP 112/72 | HR 87 | Temp 99.0°F | Resp 16 | Ht 62.21 in | Wt 160.0 lb

## 2017-11-21 DIAGNOSIS — G8929 Other chronic pain: Secondary | ICD-10-CM | POA: Diagnosis not present

## 2017-11-21 DIAGNOSIS — M5442 Lumbago with sciatica, left side: Secondary | ICD-10-CM

## 2017-11-21 DIAGNOSIS — M5136 Other intervertebral disc degeneration, lumbar region: Secondary | ICD-10-CM | POA: Diagnosis not present

## 2017-11-21 DIAGNOSIS — M5137 Other intervertebral disc degeneration, lumbosacral region: Secondary | ICD-10-CM

## 2017-11-21 MED ORDER — HYDROCODONE-ACETAMINOPHEN 7.5-325 MG PO TABS: 1.0000 | ORAL_TABLET | Freq: Four times a day (QID) | ORAL | 0 refills | Status: DC | PRN

## 2017-11-21 NOTE — Progress Notes (Signed)
Subjective:    Patient ID: Nichole Klein, female    DOB: 1965-11-20, 52 y.o.   MRN: [id number]  11/21/2017  Back Pain (follow-up )    HPI This 52 y.o. female presents for evaluation of chronic lower back pain.  Also having neck pain.  Lauren is [redacted] weeks pregnant.   Cannot go back to work.  Not having surgery; changed mind.   Having neck pain. S/p Toradol injection at the last visit with ortho; called ortho that Toradol not effective.  [place].  RN advised pt that will not address cervical spine pain until after surgery for lower back.   Has not heard from office since.  Was to get a call from office regarding expense of surgery; has not heard from office. Has appointment with Raynelle FanningJulie to talk about H&P before surgery.   [employer] is not wanting to cover surgery because it is clearly workman's comp.   Will not have surgery because [employer] not wanting to pay.   85% of patients on online forums are worse after surgery.   Success of surgery is returning to 70% of original function.  Pt now feels that at 70% now; eight months in.   [employer] made patient file for disability.   Waiting on disability determination.  Endurance is down now eight months out.  One hour in wanting to sit down or stand up. Not sleeping. Backed off of pain pills.  Refuses to take opiates the rest of life.   Now deciding what pt must do to manage pain off of opiates.   Has gotten down to one at night at 8:00pm to go to sleep.  Falling asleep on couch.  Wakes up one hour later and gets in the bed.  Two hours later, pain wakes patient up.  Gets back in bed.  Wakes up at 6;00am.  At 10:30am, wants to take a nap.  If falls asleep on couch, wakes up one hour later. Heating pad is really really helpful; eases back pain.  Does that during the day. Husband is giving feedback about snapping. Trying to manage pain and mood. Takes second pain pill at 8:00am. Appreciate llife and do what can with life.  Mother is really challenging.     When pain intensifies, mother will get home.  Asking PCP to manage pain and restrictions.   This surgery is totally different than first lumbar spine surgery.   If had surgery, unable to bend over and flex for three months.  Some people require surgery to remove screws later.   Receiving long term disability from employer.  Must pay until age 52. Now requesting notes from Dr. Adriana Simasook from original lumbar spine surgery.   Has lost 19 pounds since January trying to decrease burden on lower back; no improvement in lower back pain. severity 5-6/10.  BP Readings from Last 3 Encounters:  11/21/17 112/72  10/10/17 102/60  08/22/17 122/78   Wt Readings from Last 3 Encounters:  11/21/17 160 lb (72.6 kg)  10/10/17 169 lb 1.6 oz (76.7 kg)  08/22/17 179 lb (81.2 kg)   Immunization History  Administered Date(s) Administered  . Influenza Split 05/15/2012, 04/07/2013  . Influenza-Unspecified 06/11/2014, 03/03/2016, 03/26/2017  . Pneumococcal Polysaccharide-23 07/10/2012  . Tdap 06/14/2009    Review of Systems  Constitutional: Negative for activity change, appetite change, chills, diaphoresis, fatigue, fever and unexpected weight change.  HENT: Negative for congestion, dental problem, drooling, ear discharge, ear pain, facial swelling, hearing loss, mouth sores, nosebleeds, postnasal drip,  rhinorrhea, sinus pressure, sneezing, sore throat, tinnitus, trouble swallowing and voice change.   Eyes: Negative for photophobia, pain, discharge, redness, itching and visual disturbance.  Respiratory: Negative for apnea, cough, choking, chest tightness, shortness of breath, wheezing and stridor.   Cardiovascular: Negative for chest pain, palpitations and leg swelling.  Gastrointestinal: Negative for abdominal distention, abdominal pain, anal bleeding, blood in stool, constipation, diarrhea, nausea, rectal pain and vomiting.  Endocrine: Negative for cold intolerance, heat intolerance, polydipsia, polyphagia and  polyuria.  Genitourinary: Negative for decreased urine volume, difficulty urinating, dyspareunia, dysuria, enuresis, flank pain, frequency, genital sores, hematuria, menstrual problem, pelvic pain, urgency, vaginal bleeding, vaginal discharge and vaginal pain.  Musculoskeletal: Positive for arthralgias, back pain, myalgias, neck pain and neck stiffness. Negative for gait problem and joint swelling.  Skin: Negative for color change, pallor, rash and wound.  Allergic/Immunologic: Negative for environmental allergies, food allergies and immunocompromised state.  Neurological: Negative for dizziness, tremors, seizures, syncope, facial asymmetry, speech difficulty, weakness, light-headedness, numbness and headaches.  Hematological: Negative for adenopathy. Does not bruise/bleed easily.  Psychiatric/Behavioral: Negative for agitation, behavioral problems, confusion, decreased concentration, dysphoric mood, hallucinations, self-injury, sleep disturbance and suicidal ideas. The patient is not nervous/anxious and is not hyperactive.     Past Medical History:  Diagnosis Date  . Anxiety   . Arthritis    DDD lumbar  . Hematuria 06/15/2007   s/p urology consultation [name]; s/p CT scan negative.  No cystoscopy.  Marland Kitchen Herpes labialis    Past Surgical History:  Procedure Laterality Date  . NOSE SURGERY    . SPINE SURGERY  06/14/2008   Lumbar surgery L5-S1.  [place].   No Known Allergies Current Outpatient Medications on File Prior to Visit  Medication Sig Dispense Refill  . fluticasone (FLONASE) 50 MCG/ACT nasal spray Place 2 sprays into both nostrils daily. 16 g 6  . ranitidine (ZANTAC) 150 MG tablet Take 150 mg by mouth 2 (two) times daily.     No current facility-administered medications on file prior to visit.    Social History   Socioeconomic History  . Marital status: Married    Spouse name: [name]  . Number of children: 2  . Years of education: 18  . Highest education level: Not on file    Occupational History  . Occupation: unemployed    Comment: CMA  Social Needs  . Financial resource strain: Not on file  . Food insecurity:    Worry: Not on file    Inability: Not on file  . Transportation needs:    Medical: Not on file    Non-medical: Not on file  Tobacco Use  . Smoking status: Current Every Day Smoker    Packs/day: 1.50    Years: 25.00    Pack years: 37.50  . Smokeless tobacco: Never Used  Substance and Sexual Activity  . Alcohol use: No    Comment: special occasions only.  . Drug use: No  . Sexual activity: Yes    Birth control/protection: Surgical, Post-menopausal    Comment: Husband with vasectomy.  Lifestyle  . Physical activity:    Days per week: 0 days    Minutes per session: Not on file  . Stress: Not on file  Relationships  . Social connections:    Talks on phone: Not on file    Gets together: Not on file    Attends religious service: Not on file    Active member of club or organization: Not on file    Attends meetings  of clubs or organizations: Not on file    Relationship status: Not on file  . Intimate partner violence:    Fear of current or ex partner: Not on file    Emotionally abused: Not on file    Physically abused: Not on file    Forced sexual activity: Not on file  Other Topics Concern  . Not on file  Social History Narrative   Marital status: married x 29 years; happily married; no abuse.      Children: two daughters; three grandchildren.        Employment:  CMA; unemployed in 2019      Tobacco:  1-1.25  ppd x 20 years.      Alcohol: two to three drinks per month      Drugs: none      Exercise: daily; videos; floor exercises; situps.  Jogging during lunch in 2017.        Seatbelt:  100%; no texting      Guns:  Loaded; secured.   Family History  Problem Relation Age of Onset  . Diabetes Mother   . Hypertension Mother   . Dementia Mother   . Hyperlipidemia Father   . Cancer Father        prostate  . Stroke Father         after prostate cancer surgery with post-op pneumonia.  Marland Kitchen COPD Father   . Aortic stenosis Sister   . Hypertension Sister   . Early death Sister   . Hyperlipidemia Brother   . Hypertension Brother   . Diabetes Brother   . Hyperlipidemia Brother   . Arthritis Brother   . Hypertension Brother   . Mental illness Brother        anxiety  . Stroke Brother        multiple TIAs.  . Breast cancer Neg Hx        Objective:    BP 112/72   Pulse 87   Temp 99 F (37.2 C) (Oral)   Resp 16   Ht 5' 2.21" (1.58 m)   Wt 160 lb (72.6 kg)   LMP 08/17/2013 Comment: denies preg  SpO2 98%   BMI 29.07 kg/m  Physical Exam  Constitutional: She is oriented to person, place, and time. She appears well-developed and well-nourished. No distress.  HENT:  Head: Normocephalic and atraumatic.  Right Ear: External ear normal.  Left Ear: External ear normal.  Nose: Nose normal.  Mouth/Throat: Oropharynx is clear and moist.  Eyes: Pupils are equal, round, and reactive to light. Conjunctivae and EOM are normal.  Neck: Normal range of motion. Neck supple. Carotid bruit is not present. No thyromegaly present.  Cardiovascular: Normal rate, regular rhythm, normal heart sounds and intact distal pulses. Exam reveals no gallop and no friction rub.  No murmur heard. Pulmonary/Chest: Effort normal and breath sounds normal. She has no wheezes. She has no rales.  Musculoskeletal:       Cervical back: She exhibits decreased range of motion, tenderness, pain and spasm. She exhibits no bony tenderness.  Lymphadenopathy:    She has no cervical adenopathy.  Neurological: She is alert and oriented to person, place, and time. No cranial nerve deficit.  Skin: Skin is warm and dry. No rash noted. She is not diaphoretic. No erythema. No pallor.  Psychiatric: She has a normal mood and affect. Her behavior is normal.   No results found. Depression screen [facility] 2/9 11/21/2017 10/10/2017 08/22/2017 08/01/2017 06/15/2017  Decreased  Interest 0  0 0 0 0  Down, Depressed, Hopeless 0 0 0 0 0  PHQ - 2 Score 0 0 0 0 0  Altered sleeping - - - - -  Tired, decreased energy - - - - -  Change in appetite - - - - -  Feeling bad or failure about yourself  - - - - -  Trouble concentrating - - - - -  Moving slowly or fidgety/restless - - - - -  Suicidal thoughts - - - - -  PHQ-9 Score - - - - -  Difficult doing work/chores - - - - -   Fall Risk  11/21/2017 10/10/2017 08/22/2017 08/01/2017 06/15/2017  Falls in the past year? No No No No No        Assessment & Plan:   1. Degenerative disc disease at L5-S1 level   2. Chronic left-sided low back pain with left-sided sciatica    Unchanged lower back pain and cervical neck pain.   Maintained on two hydrocodone daily in attempts to limit opiates. No lifting greater than ten pounds, no repetitive pushing/pulling/bending/twisting/squatting.  No prolonged sitting or standing.   Refill of hydrocodone provided.    No orders of the defined types were placed in this encounter.  Meds ordered this encounter  Medications  . DISCONTD: HYDROcodone-acetaminophen (NORCO) 7.5-325 MG tablet    Sig: Take 1 tablet by mouth every 6 (six) hours as needed for moderate pain.    Dispense:  120 tablet    Refill:  0    Return in about 3 months (around 02/21/2018) for follow-up chronic medical conditions [place].   Torria Fromer Paulita Fujita, M.D. Primary Care at [facility] [address] [place], [place]  [address] [phone] phone [phone] fax

## 2017-11-21 NOTE — Patient Instructions (Signed)
     IF you received an x-ray today, you will receive an invoice from [facility]. Please contact [facility] at [phone] with questions or concerns regarding your invoice.   IF you received labwork today, you will receive an invoice from [facility]. Please contact [facility] at [phone] with questions or concerns regarding your invoice.   Our billing staff will not be able to assist you with questions regarding bills from these companies.  You will be contacted with the lab results as soon as they are available. The fastest way to get your results is to activate your My Chart account. Instructions are located on the last page of this paperwork. If you have not heard from us regarding the results in 2 weeks, please contact this office.     

## 2017-12-02 ENCOUNTER — Telehealth: Payer: Self-pay | Admitting: Family Medicine

## 2017-12-02 NOTE — Telephone Encounter (Signed)
Patient needs Provider statement completed by Dr Katrinka BlazingSmith, I did not complete because I know she was seeing another provider for these issues and I didn't know the specifics of her case. I will place the forms in Dr Michaelle CopasSmith's box on 12/02/17, please return to the FMLA/Disability desk within 5-7 business days. Thank you!

## 2017-12-12 NOTE — Telephone Encounter (Signed)
Paperwork scanned and faxed on 12/12/17

## 2018-01-12 ENCOUNTER — Telehealth: Payer: Self-pay | Admitting: Family Medicine

## 2018-01-12 MED ORDER — HYDROCODONE-ACETAMINOPHEN 7.5-325 MG PO TABS: 1.0000 | ORAL_TABLET | Freq: Four times a day (QID) | ORAL | 0 refills | Status: AC | PRN

## 2018-01-12 NOTE — Telephone Encounter (Signed)
Patient calling requesting hydrocodone refill.  Has scheduled an appointment with Katrinka [facility]Smith at [facility] for 03/01/18.  Per [place] Controlled Substance Registry, last fill 11/21/17.

## 2018-01-27 ENCOUNTER — Ambulatory Visit: Payer: BLUE CROSS/BLUE SHIELD | Admitting: Podiatry

## 2018-01-27 ENCOUNTER — Ambulatory Visit (INDEPENDENT_AMBULATORY_CARE_PROVIDER_SITE_OTHER): Payer: BLUE CROSS/BLUE SHIELD

## 2018-01-27 ENCOUNTER — Other Ambulatory Visit: Payer: Self-pay | Admitting: Podiatry

## 2018-01-27 ENCOUNTER — Encounter: Payer: Self-pay | Admitting: Podiatry

## 2018-01-27 VITALS — BP 105/58 | HR 71 | Resp 16

## 2018-01-27 DIAGNOSIS — M79672 Pain in left foot: Secondary | ICD-10-CM

## 2018-01-27 DIAGNOSIS — M722 Plantar fascial fibromatosis: Secondary | ICD-10-CM | POA: Diagnosis not present

## 2018-01-27 MED ORDER — TRIAMCINOLONE ACETONIDE 10 MG/ML IJ SUSP
10.0000 mg | Freq: Once | INTRAMUSCULAR | Status: AC
  Administered 2018-01-27: 10 mg

## 2018-01-27 NOTE — Progress Notes (Signed)
   Subjective:    Patient ID: Nichole Klein, female    DOB: [birth year], 52 y.o.   MRN: [id number]  HPI    Review of Systems  All other systems reviewed and are negative.      Objective:   Physical Exam        Assessment & Plan:

## 2018-01-28 NOTE — Progress Notes (Signed)
Subjective:   Patient ID: Nichole Klein, female   DOB: 52 y.o.   MRN: [id number]   HPI Patient presents stating she is got knots on the bottom of her left foot and had one on her right foot a number years ago that we worked on.  States to get occasionally tender and it seems to be growing slightly and gradually in size.  Patient smokes 1-1/2 packs/day and would like to be active   Review of Systems  All other systems reviewed and are negative.       Objective:  Physical Exam  Constitutional: She appears well-developed and well-nourished.  Cardiovascular: Intact distal pulses.  Pulmonary/Chest: Effort normal.  Musculoskeletal: Normal range of motion.  Neurological: She is alert.  Skin: Skin is warm.  Nursing note and vitals reviewed.   Neurovascular status intact muscle strength is adequate range of motion was adequate patient found to have a nodule in the mid arch area left measuring around 7 x 7 mm with mild discomfort with no proximal edema erythema noted.  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  If it should grow in size turn color or become painful and will require excision  X-ray was negative for signs of calcification or bone pathology

## 2018-02-27 ENCOUNTER — Other Ambulatory Visit: Payer: Self-pay | Admitting: Family Medicine

## 2018-02-27 DIAGNOSIS — Z1231 Encounter for screening mammogram for malignant neoplasm of breast: Secondary | ICD-10-CM

## 2018-03-01 DIAGNOSIS — M542 Cervicalgia: Secondary | ICD-10-CM | POA: Diagnosis not present

## 2018-03-01 DIAGNOSIS — M5136 Other intervertebral disc degeneration, lumbar region: Secondary | ICD-10-CM | POA: Diagnosis not present

## 2018-03-01 DIAGNOSIS — M546 Pain in thoracic spine: Secondary | ICD-10-CM | POA: Diagnosis not present

## 2018-03-01 DIAGNOSIS — G8929 Other chronic pain: Secondary | ICD-10-CM | POA: Diagnosis not present

## 2018-03-01 DIAGNOSIS — G894 Chronic pain syndrome: Secondary | ICD-10-CM | POA: Diagnosis not present

## 2018-04-05 ENCOUNTER — Ambulatory Visit
Admission: RE | Admit: 2018-04-05 | Discharge: 2018-04-05 | Disposition: A | Discharge status: Home / Self Care | Payer: BLUE CROSS/BLUE SHIELD | Source: Ambulatory Visit | Attending: Family Medicine | Admitting: Family Medicine

## 2018-04-05 DIAGNOSIS — Z1231 Encounter for screening mammogram for malignant neoplasm of breast: Secondary | ICD-10-CM | POA: Diagnosis not present

## 2018-05-12 DIAGNOSIS — Z0271 Encounter for disability determination: Secondary | ICD-10-CM

## 2018-05-31 DIAGNOSIS — G894 Chronic pain syndrome: Secondary | ICD-10-CM | POA: Diagnosis not present

## 2018-05-31 DIAGNOSIS — M5136 Other intervertebral disc degeneration, lumbar region: Secondary | ICD-10-CM | POA: Diagnosis not present

## 2018-05-31 DIAGNOSIS — G4709 Other insomnia: Secondary | ICD-10-CM | POA: Diagnosis not present

## 2018-08-07 ENCOUNTER — Encounter: Payer: BLUE CROSS/BLUE SHIELD | Admitting: Family Medicine

## 2018-08-09 DIAGNOSIS — Z1211 Encounter for screening for malignant neoplasm of colon: Secondary | ICD-10-CM | POA: Diagnosis not present

## 2018-08-09 DIAGNOSIS — Z131 Encounter for screening for diabetes mellitus: Secondary | ICD-10-CM | POA: Diagnosis not present

## 2018-08-09 DIAGNOSIS — G894 Chronic pain syndrome: Secondary | ICD-10-CM | POA: Diagnosis not present

## 2018-08-09 DIAGNOSIS — Z13220 Encounter for screening for lipoid disorders: Secondary | ICD-10-CM | POA: Diagnosis not present

## 2018-08-09 DIAGNOSIS — Z0000 Encounter for general adult medical examination without abnormal findings: Secondary | ICD-10-CM | POA: Diagnosis not present

## 2018-08-09 DIAGNOSIS — M5136 Other intervertebral disc degeneration, lumbar region: Secondary | ICD-10-CM | POA: Diagnosis not present

## 2018-08-30 DIAGNOSIS — R457 State of emotional shock and stress, unspecified: Secondary | ICD-10-CM | POA: Diagnosis not present

## 2018-08-30 DIAGNOSIS — G894 Chronic pain syndrome: Secondary | ICD-10-CM | POA: Diagnosis not present

## 2018-08-30 DIAGNOSIS — Z720 Tobacco use: Secondary | ICD-10-CM | POA: Diagnosis not present

## 2018-08-30 DIAGNOSIS — M5136 Other intervertebral disc degeneration, lumbar region: Secondary | ICD-10-CM | POA: Diagnosis not present

## 2018-12-06 DIAGNOSIS — G894 Chronic pain syndrome: Secondary | ICD-10-CM | POA: Diagnosis not present

## 2018-12-06 DIAGNOSIS — M5136 Other intervertebral disc degeneration, lumbar region: Secondary | ICD-10-CM | POA: Diagnosis not present

## 2018-12-06 DIAGNOSIS — F5104 Psychophysiologic insomnia: Secondary | ICD-10-CM | POA: Diagnosis not present

## 2018-12-06 DIAGNOSIS — F419 Anxiety disorder, unspecified: Secondary | ICD-10-CM | POA: Diagnosis not present

## 2019-03-12 DIAGNOSIS — M5136 Other intervertebral disc degeneration, lumbar region: Secondary | ICD-10-CM | POA: Diagnosis not present

## 2019-03-12 DIAGNOSIS — M546 Pain in thoracic spine: Secondary | ICD-10-CM | POA: Diagnosis not present

## 2019-03-12 DIAGNOSIS — M47812 Spondylosis without myelopathy or radiculopathy, cervical region: Secondary | ICD-10-CM | POA: Diagnosis not present

## 2019-03-12 DIAGNOSIS — G894 Chronic pain syndrome: Secondary | ICD-10-CM | POA: Diagnosis not present

## 2019-03-12 DIAGNOSIS — F5104 Psychophysiologic insomnia: Secondary | ICD-10-CM | POA: Diagnosis not present

## 2019-03-12 DIAGNOSIS — G8929 Other chronic pain: Secondary | ICD-10-CM | POA: Diagnosis not present

## 2019-03-12 DIAGNOSIS — M40204 Unspecified kyphosis, thoracic region: Secondary | ICD-10-CM | POA: Diagnosis not present

## 2019-03-12 DIAGNOSIS — M542 Cervicalgia: Secondary | ICD-10-CM | POA: Diagnosis not present

## 2019-03-12 DIAGNOSIS — F419 Anxiety disorder, unspecified: Secondary | ICD-10-CM | POA: Diagnosis not present

## 2019-03-14 ENCOUNTER — Other Ambulatory Visit: Payer: Self-pay | Admitting: Family Medicine

## 2019-03-14 DIAGNOSIS — G8929 Other chronic pain: Secondary | ICD-10-CM

## 2019-03-14 DIAGNOSIS — M546 Pain in thoracic spine: Secondary | ICD-10-CM

## 2019-03-19 ENCOUNTER — Other Ambulatory Visit: Payer: Self-pay | Admitting: Family Medicine

## 2019-03-19 DIAGNOSIS — Z1231 Encounter for screening mammogram for malignant neoplasm of breast: Secondary | ICD-10-CM

## 2019-03-26 ENCOUNTER — Ambulatory Visit: Payer: BC Managed Care – PPO

## 2019-03-28 DIAGNOSIS — M5134 Other intervertebral disc degeneration, thoracic region: Secondary | ICD-10-CM | POA: Diagnosis not present

## 2019-04-18 DIAGNOSIS — M5412 Radiculopathy, cervical region: Secondary | ICD-10-CM | POA: Diagnosis not present

## 2019-04-18 DIAGNOSIS — M5136 Other intervertebral disc degeneration, lumbar region: Secondary | ICD-10-CM | POA: Diagnosis not present

## 2019-04-19 ENCOUNTER — Other Ambulatory Visit: Payer: Self-pay

## 2019-04-19 ENCOUNTER — Ambulatory Visit
Admission: RE | Admit: 2019-04-19 | Discharge: 2019-04-19 | Disposition: A | Discharge status: Home / Self Care | Payer: BC Managed Care – PPO | Source: Ambulatory Visit | Attending: Family Medicine | Admitting: Family Medicine

## 2019-04-19 DIAGNOSIS — Z1231 Encounter for screening mammogram for malignant neoplasm of breast: Secondary | ICD-10-CM | POA: Diagnosis not present

## 2019-04-23 DIAGNOSIS — M50222 Other cervical disc displacement at C5-C6 level: Secondary | ICD-10-CM | POA: Diagnosis not present

## 2019-05-02 DIAGNOSIS — M5136 Other intervertebral disc degeneration, lumbar region: Secondary | ICD-10-CM | POA: Diagnosis not present

## 2019-05-18 DIAGNOSIS — M50122 Cervical disc disorder at C5-C6 level with radiculopathy: Secondary | ICD-10-CM | POA: Diagnosis not present

## 2019-05-18 DIAGNOSIS — Z20828 Contact with and (suspected) exposure to other viral communicable diseases: Secondary | ICD-10-CM | POA: Diagnosis not present

## 2019-05-18 DIAGNOSIS — M5412 Radiculopathy, cervical region: Secondary | ICD-10-CM | POA: Diagnosis not present

## 2019-05-24 DIAGNOSIS — Z981 Arthrodesis status: Secondary | ICD-10-CM | POA: Diagnosis not present

## 2019-05-24 DIAGNOSIS — M50122 Cervical disc disorder at C5-C6 level with radiculopathy: Secondary | ICD-10-CM | POA: Diagnosis not present

## 2019-05-24 DIAGNOSIS — M4722 Other spondylosis with radiculopathy, cervical region: Secondary | ICD-10-CM | POA: Diagnosis not present

## 2019-05-24 DIAGNOSIS — M50222 Other cervical disc displacement at C5-C6 level: Secondary | ICD-10-CM | POA: Diagnosis not present

## 2019-05-24 DIAGNOSIS — M4322 Fusion of spine, cervical region: Secondary | ICD-10-CM | POA: Diagnosis not present

## 2019-06-30 IMAGING — MR MR LUMBAR SPINE W/O CM
5 series · 40 of 48 positions shown · non-contrast
Comparison: Lumbar MRI 12/21/2007

CLINICAL DATA: Intervertebral disc degeneration. Laminectomy 17
years ago. Low back pain.

EXAM:
MRI LUMBAR SPINE WITHOUT CONTRAST
TECHNIQUE: Multiplanar, multisequence MR imaging of the lumbar spine was
performed. No intravenous contrast was administered.

[Series 2: T2 · sagittal · 4.0mm · 0.81mm/px · 7 of 17 slices shown (1 of 2)]
[im 1/17]
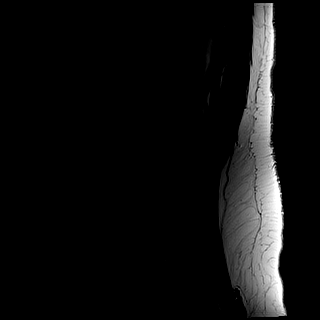
[im 3/17]
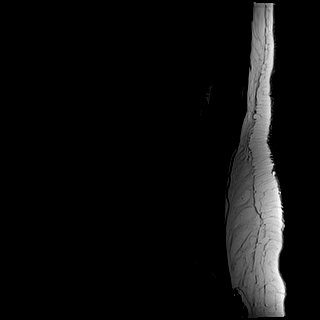
[im 6/17]
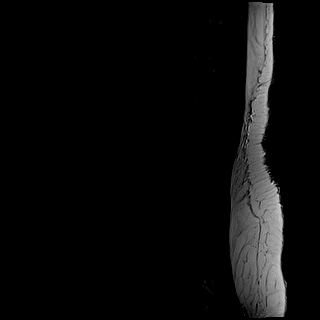
[im 9/17]
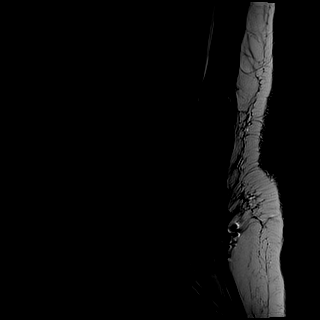
[im 11/17]
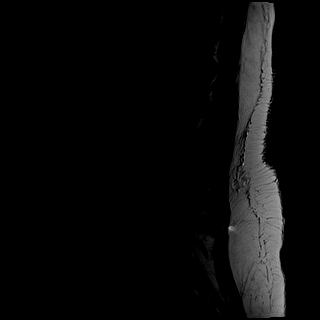
[im 14/17]
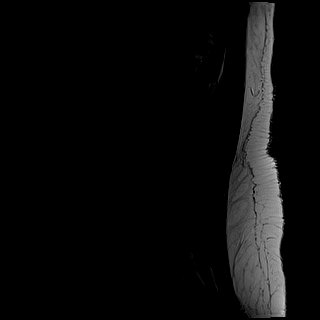
[im 17/17]
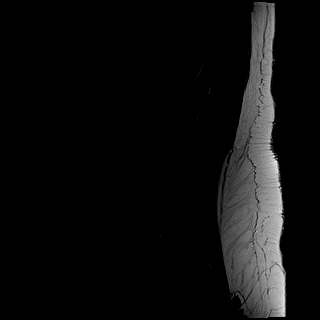

[Series 3: T1 · sagittal · 4.0mm · 0.81mm/px · 7 of 17 slices shown (1 of 2)]
[im 1/17]
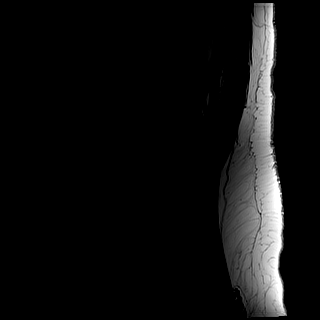
[im 3/17]
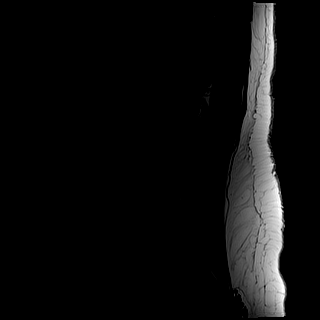
[im 6/17]
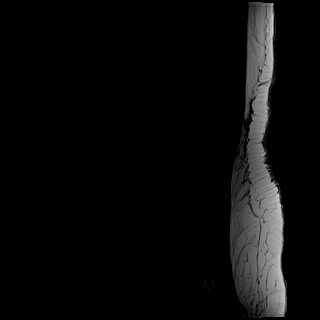
[im 9/17]
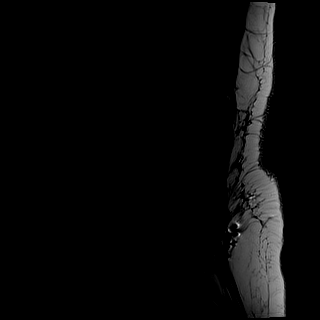
[im 11/17]
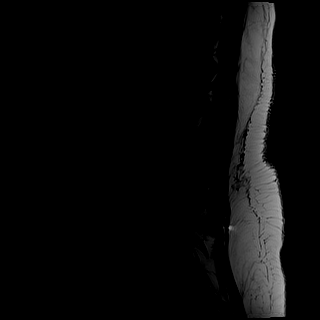
[im 14/17]
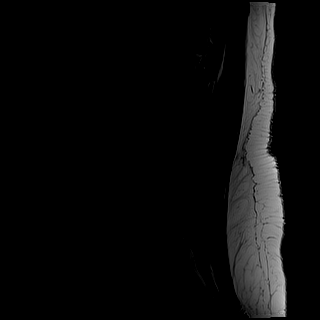
[im 17/17]
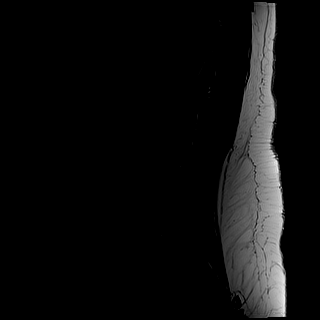

[Series 4: STIR · sagittal · 4.0mm · 1.02mm/px · 6 of 17 slices shown]
[im 1/17]
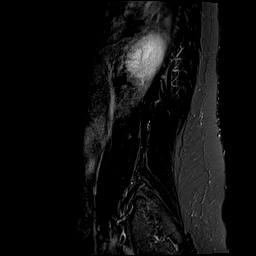
[im 4/17]
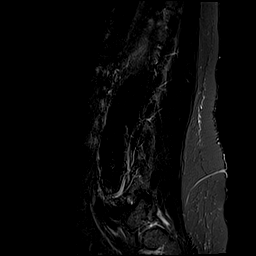
[im 7/17]
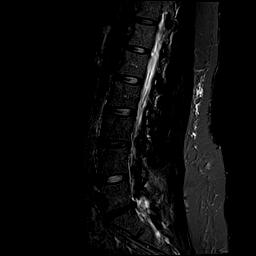
[im 10/17]
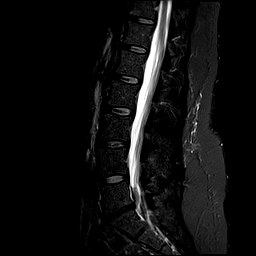
[im 13/17]
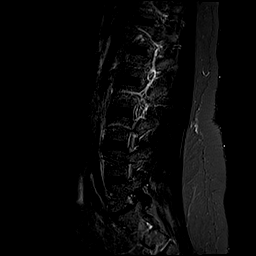
[im 17/17]
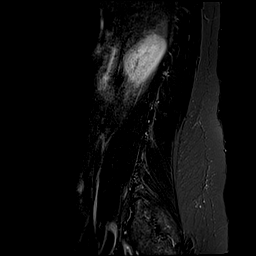

[Series 5: T2 · axial · 4.0mm · 0.78mm/px · z∈[+39,+249]mm · 12 of 38 slices shown (2 of 2)]
[im 1/38]
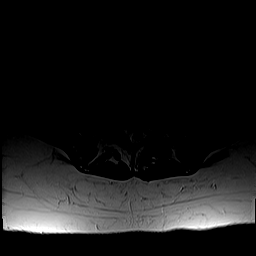
[im 3/38]
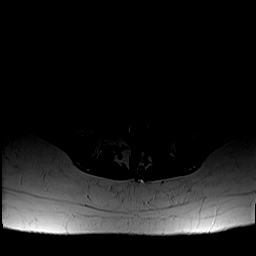
[im 6/38]
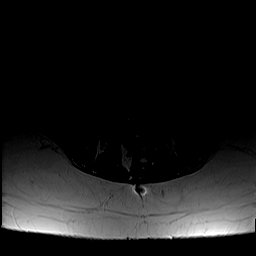
[im 9/38]
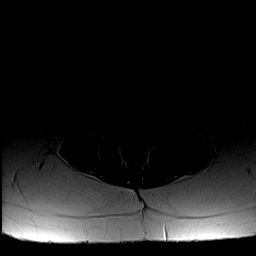
[im 12/38]
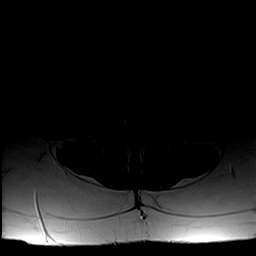
[im 15/38]
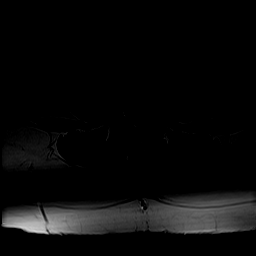
[im 18/38]
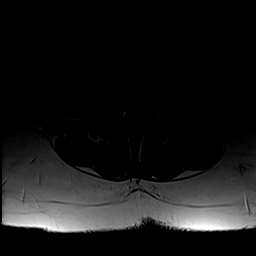
[im 20/38]
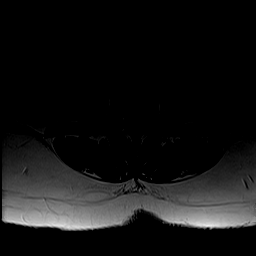
[im 23/38]
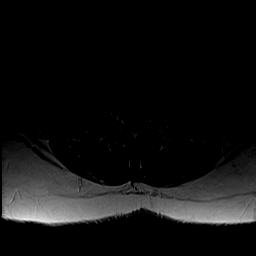
[im 26/38]
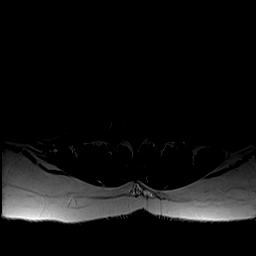
[im 32/38]
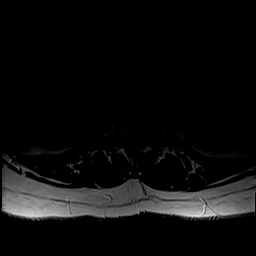
[im 38/38]
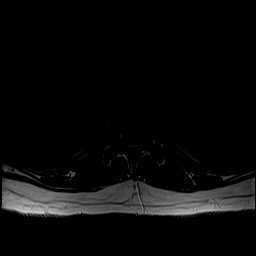

[Series 6: T1 · axial · 4.0mm · 0.39mm/px · z∈[+39,+249]mm · 8 of 38 slices shown (2 of 2)]
[im 1/38]
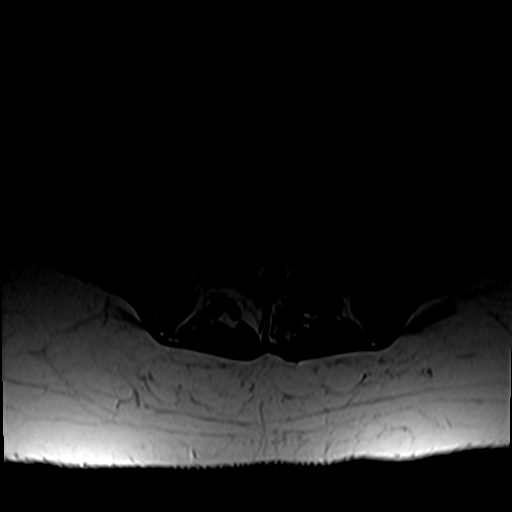
[im 6/38]
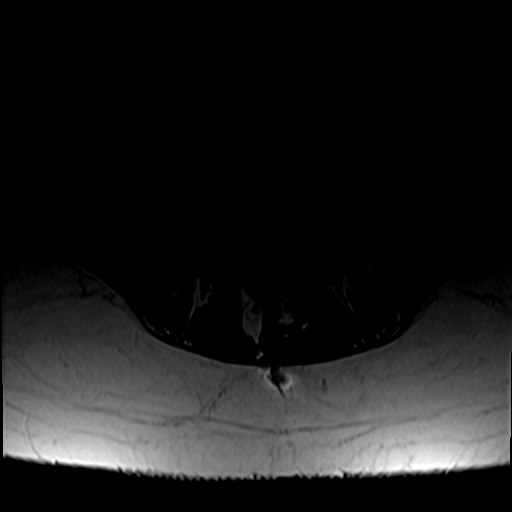
[im 12/38]
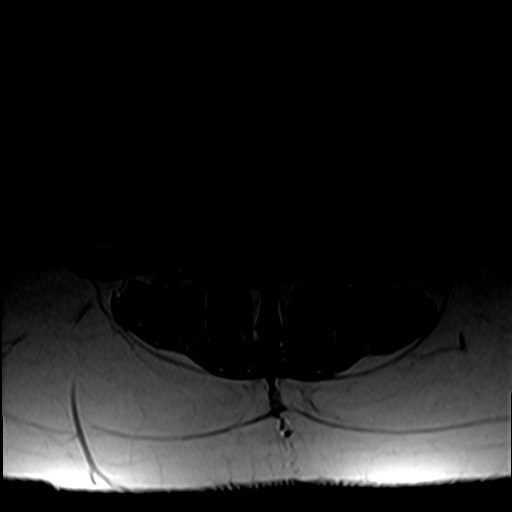
[im 18/38]
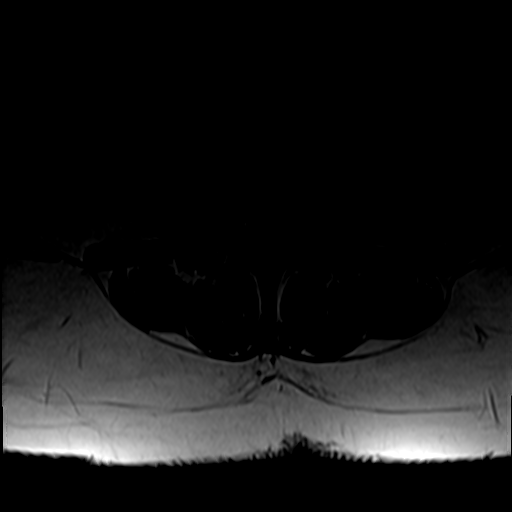
[im 20/38]
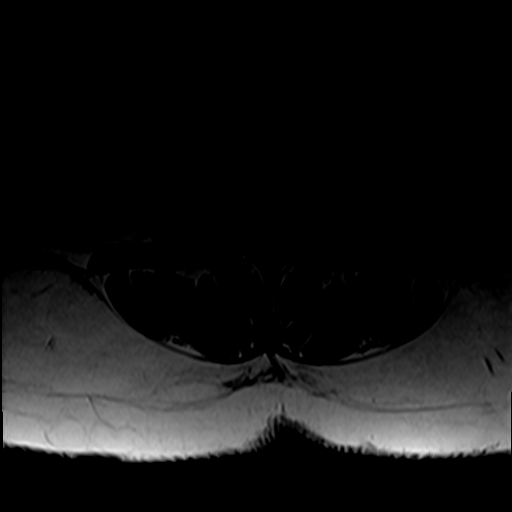
[im 26/38]
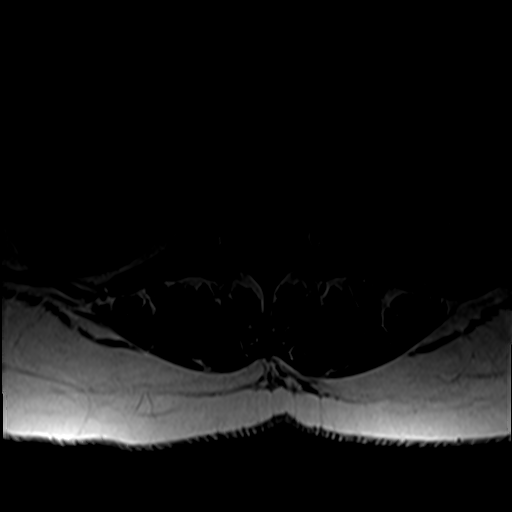
[im 32/38]
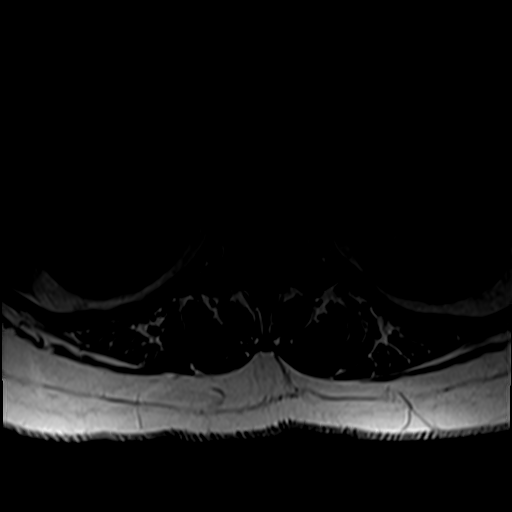
[im 38/38]
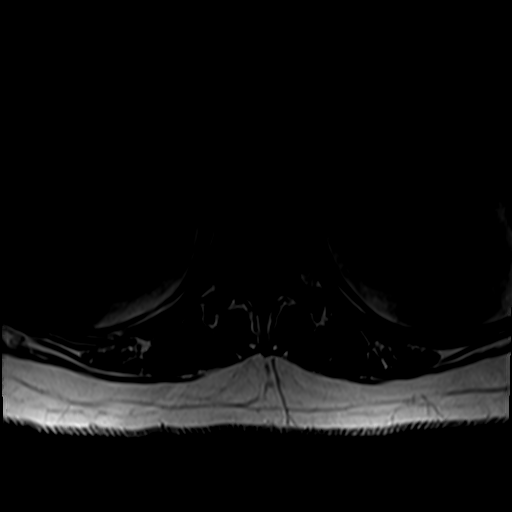

[40 of 48 positions shown; findings below may reference images not displayed]

FINDINGS: Segmentation:  Normal

Alignment:  Normal

Vertebrae:  Normal

Conus medullaris: Extends to the L1 level and appears normal.

Paraspinal and other soft tissues: Negative for mass or adenopathy

Disc levels:

L1-2:  Negative

L2-3:  Negative

L3-4:  Negative

L4-5:  Negative

L5-S1: Left laminectomy. Progressive disc degeneration and disc
space narrowing since prior study. Mild endplate spurring with mild
subarticular stenosis bilaterally. No recurrent disc protrusion
IMPRESSION: Left laminectomy L5-S1 without recurrent disc protrusion.
Progressive disc degeneration and mild spurring at L5-S1. Other disc
spaces normal.

## 2020-03-25 ENCOUNTER — Other Ambulatory Visit: Payer: Self-pay | Admitting: Family Medicine

## 2020-03-25 DIAGNOSIS — Z1231 Encounter for screening mammogram for malignant neoplasm of breast: Secondary | ICD-10-CM

## 2020-04-21 ENCOUNTER — Other Ambulatory Visit: Payer: Self-pay

## 2020-04-21 ENCOUNTER — Ambulatory Visit
Admission: RE | Admit: 2020-04-21 | Discharge: 2020-04-21 | Disposition: A | Discharge status: Home / Self Care | Payer: Medicare PPO | Source: Ambulatory Visit | Attending: Family Medicine | Admitting: Family Medicine

## 2020-04-21 DIAGNOSIS — Z1231 Encounter for screening mammogram for malignant neoplasm of breast: Secondary | ICD-10-CM | POA: Diagnosis not present

## 2020-08-15 ENCOUNTER — Other Ambulatory Visit: Payer: Self-pay | Admitting: Family Medicine

## 2020-08-15 ENCOUNTER — Other Ambulatory Visit (HOSPITAL_COMMUNITY): Payer: Self-pay | Admitting: Family Medicine

## 2020-08-15 DIAGNOSIS — R590 Localized enlarged lymph nodes: Secondary | ICD-10-CM

## 2020-08-28 ENCOUNTER — Ambulatory Visit: Payer: Medicare HMO

## 2020-09-09 ENCOUNTER — Ambulatory Visit: Payer: Medicare HMO

## 2020-09-10 ENCOUNTER — Ambulatory Visit
Admission: RE | Admit: 2020-09-10 | Discharge: 2020-09-10 | Disposition: A | Discharge status: Home / Self Care | Payer: Medicare HMO | Source: Ambulatory Visit | Attending: Family Medicine | Admitting: Family Medicine

## 2020-09-10 ENCOUNTER — Other Ambulatory Visit: Payer: Self-pay

## 2020-09-10 DIAGNOSIS — R590 Localized enlarged lymph nodes: Secondary | ICD-10-CM | POA: Diagnosis present

## 2020-09-10 MED ORDER — IOHEXOL 300 MG/ML  SOLN
75.0000 mL | Freq: Once | INTRAMUSCULAR | Status: AC | PRN
  Administered 2020-09-10: 75 mL via INTRAVENOUS

## 2021-03-18 ENCOUNTER — Other Ambulatory Visit: Payer: Self-pay | Admitting: Family Medicine

## 2021-03-18 DIAGNOSIS — Z1231 Encounter for screening mammogram for malignant neoplasm of breast: Secondary | ICD-10-CM

## 2021-05-25 ENCOUNTER — Other Ambulatory Visit: Payer: Self-pay

## 2021-05-25 ENCOUNTER — Ambulatory Visit
Admission: RE | Admit: 2021-05-25 | Discharge: 2021-05-25 | Disposition: A | Discharge status: Home / Self Care | Payer: Medicare HMO | Source: Ambulatory Visit | Attending: Family Medicine | Admitting: Family Medicine

## 2021-05-25 DIAGNOSIS — Z1231 Encounter for screening mammogram for malignant neoplasm of breast: Secondary | ICD-10-CM

## 2022-03-16 ENCOUNTER — Ambulatory Visit
Admission: EM | Admit: 2022-03-16 | Discharge: 2022-03-16 | Disposition: A | Discharge status: Home / Self Care | Payer: Medicare HMO

## 2022-03-16 NOTE — ED Triage Notes (Signed)
Patient presents to UC for SOB, chest congestion, cough since last Thursday. Treating symptoms sudafed, cough syrup, and oxy for pain. Two negative rapid tests.Hx of pneumonia.

## 2022-05-10 ENCOUNTER — Other Ambulatory Visit: Payer: Self-pay | Admitting: Family Medicine

## 2022-05-10 DIAGNOSIS — Z1231 Encounter for screening mammogram for malignant neoplasm of breast: Secondary | ICD-10-CM

## 2022-05-31 ENCOUNTER — Ambulatory Visit
Admission: RE | Admit: 2022-05-31 | Discharge: 2022-05-31 | Disposition: A | Discharge status: Home / Self Care | Payer: Medicare HMO | Source: Ambulatory Visit | Attending: Family Medicine | Admitting: Family Medicine

## 2022-05-31 DIAGNOSIS — Z1231 Encounter for screening mammogram for malignant neoplasm of breast: Secondary | ICD-10-CM | POA: Diagnosis present

## 2022-06-02 ENCOUNTER — Encounter: Payer: Self-pay | Admitting: Podiatry

## 2022-06-02 ENCOUNTER — Ambulatory Visit: Payer: Medicare HMO | Admitting: Podiatry

## 2022-06-02 ENCOUNTER — Ambulatory Visit: Payer: BLUE CROSS/BLUE SHIELD | Admitting: Podiatry

## 2022-06-02 ENCOUNTER — Ambulatory Visit (INDEPENDENT_AMBULATORY_CARE_PROVIDER_SITE_OTHER): Payer: Medicare HMO

## 2022-06-02 DIAGNOSIS — M79672 Pain in left foot: Secondary | ICD-10-CM | POA: Diagnosis not present

## 2022-06-02 DIAGNOSIS — M722 Plantar fascial fibromatosis: Secondary | ICD-10-CM

## 2022-06-02 MED ORDER — TRIAMCINOLONE ACETONIDE 10 MG/ML IJ SUSP
10.0000 mg | Freq: Once | INTRAMUSCULAR | Status: AC
  Administered 2022-06-02: 10 mg

## 2022-06-02 NOTE — Progress Notes (Signed)
Subjective:   Patient ID: Nichole Klein, female   DOB: 56 y.o.   MRN: [id number]   HPI Patient presents stating there is been a reoccurrence of nodule in the left foot over the last 2 years and she had this in the right foot and it did well with treatment 4 years ago.  Patient smokes a pack and 1/2/day tries to be active   Review of Systems  All other systems reviewed and are negative.       Objective:  Physical Exam Vitals and nursing note reviewed.  Constitutional:      Appearance: She is well-developed.  Pulmonary:     Effort: Pulmonary effort is normal.  Musculoskeletal:        General: Normal range of motion.  Skin:    General: Skin is warm.  Neurological:     Mental Status: She is alert.     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

## 2022-06-03 ENCOUNTER — Other Ambulatory Visit: Payer: Self-pay | Admitting: Podiatry

## 2022-06-03 DIAGNOSIS — M79672 Pain in left foot: Secondary | ICD-10-CM

## 2022-06-03 DIAGNOSIS — M722 Plantar fascial fibromatosis: Secondary | ICD-10-CM

## 2022-08-27 ENCOUNTER — Other Ambulatory Visit: Payer: Self-pay | Admitting: Family Medicine

## 2022-08-27 DIAGNOSIS — G8929 Other chronic pain: Secondary | ICD-10-CM

## 2022-09-07 ENCOUNTER — Ambulatory Visit
Admission: RE | Admit: 2022-09-07 | Discharge: 2022-09-07 | Disposition: A | Discharge status: Home / Self Care | Payer: Medicare HMO | Source: Ambulatory Visit | Attending: Family Medicine | Admitting: Family Medicine

## 2022-09-07 DIAGNOSIS — M545 Low back pain, unspecified: Secondary | ICD-10-CM | POA: Diagnosis present

## 2022-09-07 DIAGNOSIS — G8929 Other chronic pain: Secondary | ICD-10-CM | POA: Diagnosis present

## 2022-09-07 MED ORDER — GADOBUTROL 1 MMOL/ML IV SOLN
7.5000 mL | Freq: Once | INTRAVENOUS | Status: AC | PRN
  Administered 2022-09-07: 7.5 mL via INTRAVENOUS

## 2023-05-23 ENCOUNTER — Other Ambulatory Visit: Payer: Self-pay | Admitting: Family Medicine

## 2023-05-23 DIAGNOSIS — Z1231 Encounter for screening mammogram for malignant neoplasm of breast: Secondary | ICD-10-CM

## 2023-06-03 ENCOUNTER — Ambulatory Visit
Admission: RE | Admit: 2023-06-03 | Discharge: 2023-06-03 | Disposition: A | Discharge status: Home / Self Care | Payer: Medicare HMO | Source: Ambulatory Visit | Attending: Family Medicine | Admitting: Family Medicine

## 2023-06-03 DIAGNOSIS — Z1231 Encounter for screening mammogram for malignant neoplasm of breast: Secondary | ICD-10-CM | POA: Insufficient documentation

## 2024-07-12 ENCOUNTER — Other Ambulatory Visit: Payer: Self-pay | Admitting: Family Medicine

## 2024-07-12 DIAGNOSIS — Z1231 Encounter for screening mammogram for malignant neoplasm of breast: Secondary | ICD-10-CM
